# Patient Record
Sex: Female | Born: 1985 | Race: White | Hispanic: No | Marital: Married | Smoking: Current every day smoker
Health system: Southern US, Community
[De-identification: ages and names within clinical notes are randomized; demographics above are authoritative.]

## PROBLEM LIST (undated history)

## (undated) DIAGNOSIS — R519 Headache, unspecified: Secondary | ICD-10-CM

## (undated) DIAGNOSIS — O0990 Supervision of high risk pregnancy, unspecified, unspecified trimester: Secondary | ICD-10-CM

## (undated) DIAGNOSIS — D279 Benign neoplasm of unspecified ovary: Secondary | ICD-10-CM

## (undated) DIAGNOSIS — N979 Female infertility, unspecified: Secondary | ICD-10-CM

## (undated) DIAGNOSIS — R51 Headache: Secondary | ICD-10-CM

## (undated) DIAGNOSIS — T8859XA Other complications of anesthesia, initial encounter: Secondary | ICD-10-CM

## (undated) DIAGNOSIS — R635 Abnormal weight gain: Secondary | ICD-10-CM

## (undated) DIAGNOSIS — E282 Polycystic ovarian syndrome: Secondary | ICD-10-CM

## (undated) DIAGNOSIS — T4145XA Adverse effect of unspecified anesthetic, initial encounter: Secondary | ICD-10-CM

## (undated) DIAGNOSIS — S92909A Unspecified fracture of unspecified foot, initial encounter for closed fracture: Secondary | ICD-10-CM

## (undated) HISTORY — DX: Abnormal weight gain: R63.5

## (undated) HISTORY — PX: ARTHROSCOPIC REPAIR ACL: SUR80

## (undated) HISTORY — PX: WISDOM TOOTH EXTRACTION: SHX21

## (undated) HISTORY — DX: Supervision of high risk pregnancy, unspecified, unspecified trimester: O09.90

## (undated) HISTORY — DX: Female infertility, unspecified: N97.9

---
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
## 2007-10-16 ENCOUNTER — Ambulatory Visit: Payer: Self-pay | Admitting: Occupational Medicine

## 2007-10-16 LAB — CONVERTED CEMR LAB: Beta hcg, urine, semiquantitative: NEGATIVE

## 2007-10-27 ENCOUNTER — Ambulatory Visit: Payer: Self-pay | Admitting: Occupational Medicine

## 2007-10-27 DIAGNOSIS — J209 Acute bronchitis, unspecified: Secondary | ICD-10-CM

## 2007-10-30 ENCOUNTER — Telehealth (INDEPENDENT_AMBULATORY_CARE_PROVIDER_SITE_OTHER): Payer: Self-pay | Admitting: Occupational Medicine

## 2008-04-22 ENCOUNTER — Ambulatory Visit: Payer: Self-pay | Admitting: Family Medicine

## 2008-04-22 DIAGNOSIS — M25519 Pain in unspecified shoulder: Secondary | ICD-10-CM

## 2010-01-01 ENCOUNTER — Ambulatory Visit: Payer: Self-pay | Admitting: Emergency Medicine

## 2010-03-13 NOTE — Assessment & Plan Note (Signed)
Summary: Sinus/eye pressure, cough/runny nose - greenish x 8-9 dys rm 5   Vital Signs:  Patient Profile:   25 Years Old Female CC:      Cold & URI symptoms Height:     64 inches Weight:      219 pounds O2 Sat:      98 % O2 treatment:    Room Air Temp:     98.2 degrees F oral Pulse rate:   85 / minute Pulse rhythm:   regular Resp:     15 per minute BP sitting:   132 / 84  (left arm) Cuff size:   regular  Vitals Entered By: Areta Haber CMA (January 01, 2010 12:25 PM)                  Current Allergies: ! PENICILLIN V POTASSIUM (PENICILLIN V POTASSIUM)     History of Present Illness Chief Complaint: Cold & URI symptoms History of Present Illness: Patient complains of onset of cold symptoms for 9 days.  They have been using Dayquil, Nyquil, and Mucinex which is helping a little bit.  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 + sore throat + cough No pleuritic pain No wheezing + nasal congestion + post-nasal drainage + sinus pain/pressure No itchy/red eyes No earache No hemoptysis No SOB + chills/sweats No fever No nausea No vomiting No abdominal pain No diarrhea No skin rashes + fatigue + myalgias No headache   Current Problems: UPPER RESPIRATORY INFECTION, ACUTE (ICD-465.9) SHOULDER PAIN, LEFT (ICD-719.41) PREVENTIVE HEALTH CARE (ICD-V70.0) BRONCHITIS, ACUTE (ICD-466.0)   Current Meds DAYQUIL MULTI-SYMPTOM 30-325-10 MG/15ML LIQD (PSEUDOEPHEDRINE-APAP-DM) as directed VICKS NYQUIL MULTI-SYMPTOM 15-6.25-325 MG CAPS (DM-DOXYLAMINE-ACETAMINOPHEN) as directed * CHLORMED as directed by OBGYN ZITHROMAX Z-PAK 250 MG TABS (AZITHROMYCIN) use as directed PREDNISONE 10 MG TABS (PREDNISONE) 20mg  two times a day for 5 days  REVIEW OF SYSTEMS Constitutional Symptoms      Denies fever, chills, night sweats, weight loss, weight gain, and fatigue.  Eyes       Complains of eye pain.      Denies change in vision, eye  discharge, glasses, contact lenses, and eye surgery.      Comments: pressure Ear/Nose/Throat/Mouth       Complains of frequent runny nose, sinus problems, sore throat, and hoarseness.      Denies hearing loss/aids, change in hearing, ear pain, ear discharge, dizziness, frequent nose bleeds, and tooth pain or bleeding.      Comments: greenisxh x 8-9 dys Respiratory       Complains of productive cough.      Denies dry cough, wheezing, shortness of breath, asthma, bronchitis, and emphysema/COPD.      Comments: chest congestion Cardiovascular       Denies murmurs, chest pain, and tires easily with exhertion.    Gastrointestinal       Complains of stomach pain and nausea/vomiting.      Denies diarrhea, constipation, blood in bowel movements, and indigestion. Genitourniary       Denies painful urination, kidney stones, and loss of urinary control. Neurological       Complains of headaches.      Denies paralysis, seizures, and fainting/blackouts. Musculoskeletal       Denies muscle pain, joint pain, joint stiffness, decreased range of motion, redness, swelling, muscle weakness, and gout.  Skin       Denies bruising, unusual mles/lumps or sores, and hair/skin or nail changes.  Psych  Denies mood changes, temper/anger issues, anxiety/stress, speech problems, depression, and sleep problems. Other Comments: Pt does not have a PCP. Card given.   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  Married to [employer]. 1 child.  Former Smoker Alcohol use-yes Drug use-no Regular exercise-yes  Risk Factors: Alcohol Use: <1 (04/22/2008) Diet: fair (04/22/2008) Exercise: yes (04/22/2008)  Risk  Factors: Smoking Status: quit (04/22/2008) Physical Exam General appearance: well developed, well nourished, no acute distress Ears: normal, no lesions or deformities Nasal: mucosa pink, nonedematous, no septal deviation, turbinates normal Oral/Pharynx: pharyngeal erythema without exudate, uvula midline without deviation Chest/Lungs: no rales, wheezes, or rhonchi bilateral, breath sounds equal without effort Heart: regular rate and  rhythm, no murmur Skin: no obvious rashes or lesions MSE: oriented to time, place, and person Assessment New Problems: UPPER RESPIRATORY INFECTION, ACUTE (ICD-465.9)   Patient Education: Patient and/or caregiver instructed in the following: rest, fluids, Tylenol prn.  Plan New Medications/Changes: PREDNISONE 10 MG TABS (PREDNISONE) 20mg  two times a day for 5 days  #QS x 0, 01/01/2010, Hoyt Koch MD ZITHROMAX Z-PAK 250 MG TABS (AZITHROMYCIN) use as directed  #1 x 0, 01/01/2010, Hoyt Koch MD  New Orders: Est. Patient Level III [phone] Planning Comments:   1)  Take the prescribed antibiotic as instructed. 2)  Use nasal saline solution (over the counter) at least 3 times a day. 3)  Use over the counter decongestants like Zyrtec-D every 12 hours as needed to help with congestion. 4)  Can take tylenol every 6 hours or motrin every 8 hours for pain or fever. 5)  Follow up with your primary doctor  if no improvement in 5-7 days, sooner if increasing pain, fever, or new symptoms.  6) Continue with Nyquil for cough and cold symptoms   The patient and/or caregiver has been counseled thoroughly with regard to medications prescribed including dosage, schedule, interactions, rationale for use, and possible side effects and they verbalize understanding.  Diagnoses and expected course of recovery discussed and will return if not improved as expected or if the condition worsens. Patient and/or caregiver verbalized understanding.   Prescriptions: PREDNISONE 10 MG TABS (PREDNISONE) 20mg  two times a day for 5 days  #QS x 0   Entered and Authorized by:   Hoyt Koch MD   Signed by:   Hoyt Koch MD on 01/01/2010   Method used:   Print then Give to Patient   RxID:   [id number] ZITHROMAX Z-PAK 250 MG TABS (AZITHROMYCIN) use as directed  #1 x 0   Entered and Authorized by:   Hoyt Koch MD   Signed by:   Hoyt Koch MD on 01/01/2010   Method used:   Print then Give to Patient   RxID:   [id number]   Orders Added: 1)  Est. Patient Level III [29528]

## 2010-06-28 ENCOUNTER — Telehealth: Payer: Self-pay | Admitting: Family Medicine

## 2010-06-28 NOTE — Telephone Encounter (Signed)
Pt called and hasn't been seen here in a while, but is  Coming back and front office sched for next Tuesday 07-03-10 @ 1:00pm.  Has been seeing OB-GYN but not happy there and doesn't feel they are doing anything for her irregular cycles.  Would like to let Dr. Linford Arnold handle GYN care or at least refer her to someone different.  Using super plus tampon every 2 hours.  Chest discomfort is not assoc with any significant symptoms, but rarely enough has strong family history of young members dying heart attacks recently.   Plan:  An 30 minute appt was scheduled by the front office, and pt was instructed to try some TUMS OTC or maalox antacid to see if would relieve.  Pt noticed chest discomfort when the irreg.cycle started a week ago.  Probable heart burn or indigestion.  Pt instructed to do bland diet and avoid greasy, spicy, fried foods, and to lay off citrus type drinks.  Pt voiced understanding and will call if chest discomfort starts up again. Routed to Dr. Marlyne Beards, LPN [facility]

## 2010-07-03 ENCOUNTER — Ambulatory Visit (INDEPENDENT_AMBULATORY_CARE_PROVIDER_SITE_OTHER): Payer: PRIVATE HEALTH INSURANCE | Admitting: Family Medicine

## 2010-07-03 DIAGNOSIS — N920 Excessive and frequent menstruation with regular cycle: Secondary | ICD-10-CM

## 2010-07-03 DIAGNOSIS — R0789 Other chest pain: Secondary | ICD-10-CM

## 2010-07-03 DIAGNOSIS — N926 Irregular menstruation, unspecified: Secondary | ICD-10-CM

## 2010-07-03 NOTE — Progress Notes (Signed)
  Subjective:    Patient ID: Valerie Shaw, female    DOB: July 22, 1985, 25 y.o.   MRN: [id number]  HPI Says has always had problems with her periods. She has been on birth control for awhile to help her regulate her periods. She came off several years ago because she wanted to get pregnant. She does have one child but was able to get pregnant after one round of Clomid. She will sometimes skip a period and sometimes will bleed for a month. She feels her periods were heavy.  She has been on and off clomid for the last 2 years.  Her periods lastely have been much heavier. Has had a normal pap. She has been going to [facility].  She does complain of heavy hair growth on her chin which she has to wax. She also has acne on her face. She has significant difficulty losing weight and has been overweight most of her life. Though she does weigh more now than she has in a while. She says she has occasionally had abnormal sugar levels when they been tested for different reasons such as at work. At other times they're completely normal.  She also complains of atypical chest pain today. She says it's mostly in the middle of the chest. At times it can be a very sharp intense pain it only lasts seconds. At other times he can go more distal Akin actually last for several hours. It is not triggered by activity. She is not sure if it is triggered by food or not eating. No fever or cough or shortness of breath. No prior history of asthma. No prior history of gallbladder problems. No recent changes in bowel movements. No blood in the stool. She is very concerned because she has a cousin who recently died at age 10 from some type of heart disease. She's not sure exactly what. She says she's never had reflux in her life but did try some TUMS at the last week and says sometimes it actually did seem to ease her symptoms. She denies any brash. Review of Systems     Objective:   Physical Exam    she is an overweight female she  does have some evidence of on her chin as well as some cystic acne on her chin.    Assessment & Plan:  Menorrhagia- explained to her that the most common treatments are birth control. We can certainly decrease her bleeding immediately by using Provera but she says her bleeding is that she started to finally taper off for this particular episode. I also discussed ablation as an option but not while trying to get pregnant as this causes scar tissue and impedance fertility. We did discuss the importance of weight loss and reducing her overall estrogen level III weight loss and this may help with the menorrhagia as well.  Irregular periods - Consider PCOS. She has opligomenorrhea, acne, obese, infertility, excess facial hair growth. Will get some labs to rule out thyroid dysfunction, hyperprolactinemia, etc.  If all normal then consider starting metformin. We also discussed the importance of exercise adn weight loss and low fat diet.  This may also improve her infertility.   We spent 25 minutes in discussion about these particular issues and discussing options for treatment.

## 2010-07-04 ENCOUNTER — Telehealth: Payer: Self-pay | Admitting: Family Medicine

## 2010-07-04 DIAGNOSIS — R0789 Other chest pain: Secondary | ICD-10-CM | POA: Insufficient documentation

## 2010-07-04 LAB — LIPID PANEL
Cholesterol: 171 mg/dL (ref 0–200)
Total CHOL/HDL Ratio: 4.1 Ratio
Triglycerides: 154 mg/dL — ABNORMAL HIGH (ref <150)

## 2010-07-04 LAB — GLUCOSE, RANDOM: Glucose, Bld: 85 mg/dL (ref 70–99)

## 2010-07-04 LAB — TSH: TSH: 1.322 u[IU]/mL (ref 0.350–4.500)

## 2010-07-04 LAB — TESTOSTERONE, FREE, TOTAL, SHBG: Testosterone: 100.71 ng/dL — ABNORMAL HIGH (ref 10–70)

## 2010-07-04 MED ORDER — METFORMIN HCL 500 MG PO TABS: 500.0000 mg | ORAL_TABLET | Freq: Three times a day (TID) | ORAL | Status: DC

## 2010-07-04 NOTE — Assessment & Plan Note (Signed)
This is unlikely to be cardiac based on her age that period that she is very concerned because she recently had a cousin that died age 26 from some type of heart disease. We did EKG today which was normal. Normal sinus rhythm with no acute abnormalities. I really do think that this could be reflux. She did try some times over the last week and says that sometimes it did actually help relieve her symptoms. I gave her samples of PPI that we had here in the office for her to try for the next 10 days. She's to call me in a week if she feels it is helping and we can get her prescription. She feels it is not helping then we will need to consider further evaluation.

## 2010-07-04 NOTE — Telephone Encounter (Signed)
Call pt: Labs are nl except for testosterone levels are slightly elevate which is commond in about 60-80% of women with PCOS. I would really like her to try metformin for PCOS.  I will send over rx. Can cause some loose stools when first start it but that usually gets better after a couple of weeks. If loose stools a problem then can back down to twice a day on the med.  Alo work on low fat diet and exercise for weight loss while on the metformin ot improve fertility. F/U in 2 mo.

## 2010-07-05 ENCOUNTER — Telehealth: Payer: Self-pay | Admitting: *Deleted

## 2010-07-05 MED ORDER — MEDROXYPROGESTERONE ACETATE 10 MG PO TABS: 10.0000 mg | ORAL_TABLET | Freq: Every day | ORAL | Status: DC

## 2010-07-05 NOTE — Telephone Encounter (Addendum)
Pt called and states she would like the rx that you and she had talked about in re to stopping her heavy cycle. Pt states she wants it called into [facility] in [place]

## 2010-07-05 NOTE — Telephone Encounter (Signed)
Called and left message to call back re labs

## 2010-07-05 NOTE — Telephone Encounter (Signed)
Provera sent. Take for 10 days and then stop. It will stop her period for now but it will likely restart after 10 days but lighter.

## 2010-07-06 NOTE — Telephone Encounter (Signed)
Called and left message on cell that rx was sent and dr instructions

## 2010-07-06 NOTE — Telephone Encounter (Signed)
Pt.notified

## 2010-07-20 ENCOUNTER — Encounter: Payer: Self-pay | Admitting: Family Medicine

## 2010-08-03 ENCOUNTER — Ambulatory Visit: Payer: PRIVATE HEALTH INSURANCE | Admitting: Family Medicine

## 2012-01-13 ENCOUNTER — Encounter: Payer: Self-pay | Admitting: Emergency Medicine

## 2012-01-13 ENCOUNTER — Emergency Department
Admission: EM | Admit: 2012-01-13 | Discharge: 2012-01-13 | Disposition: A | Discharge status: Home / Self Care | Payer: PRIVATE HEALTH INSURANCE | Source: Home / Self Care | Attending: Family Medicine | Admitting: Family Medicine

## 2012-01-13 DIAGNOSIS — J069 Acute upper respiratory infection, unspecified: Secondary | ICD-10-CM

## 2012-01-13 DIAGNOSIS — M940 Chondrocostal junction syndrome [Tietze]: Secondary | ICD-10-CM

## 2012-01-13 HISTORY — DX: Polycystic ovarian syndrome: E28.2

## 2012-01-13 MED ORDER — AZITHROMYCIN 250 MG PO TABS: ORAL_TABLET | ORAL | Status: DC

## 2012-01-13 MED ORDER — BENZONATATE 200 MG PO CAPS: 200.0000 mg | ORAL_CAPSULE | Freq: Every day | ORAL | Status: DC

## 2012-01-13 NOTE — ED Provider Notes (Signed)
History     CSN: [id number]  Arrival date & time 01/13/12  1014   First MD Initiated Contact with Patient 01/13/12 1114      Chief Complaint  Patient presents with  . Cough      HPI Comments: Patient complains of approximately 7 day history of gradually progressive URI symptoms beginning with a mild nasal congestion.  A cough started about 3 days ago.  Complains of fatigue but no myalgias.  Cough is now worse at night and generally non-productive during the day.  There has been no shortness of breath, or wheezes, but she complains of tightness in her anterior chest.  The history is provided by the patient.    Past Medical History  Diagnosis Date  . PCOS (polycystic ovarian syndrome)     Past Surgical History  Procedure Date  . Cesarean section     Family History  Problem Relation Age of Onset  . Drug abuse Father     History  Substance Use Topics  . Smoking status: Former Smoker    Types: Cigarettes    Quit date: 01/12/2005  . Smokeless tobacco: Not on file  . Alcohol Use: Yes    OB History    Grav Para Term Preterm Abortions TAB SAB Ect Mult Living                  Review of Systems Minimal sore throat + cough No pleuritic pain but complains of tightness in anterior chest No wheezing + nasal congestion + post-nasal drainage No sinus pain/pressure No itchy/red eyes No earache No hemoptysis No SOB No fever/chills No nausea No vomiting No abdominal pain No diarrhea No urinary symptoms No skin rashes + fatigue ? myalgias + headache Used OTC meds without relief  Allergies  Penicillins  Home Medications   Current Outpatient Rx  Name  Route  Sig  Dispense  Refill  . AZITHROMYCIN 250 MG PO TABS      Take 2 tabs today; then begin one tab once daily for 4 more days. (Rx void after 01/20/12)   6 each   0   . BENZONATATE 200 MG PO CAPS   Oral   Take 1 capsule (200 mg total) by mouth at bedtime. Take as needed for cough   12 capsule   0     . MEDROXYPROGESTERONE ACETATE 10 MG PO TABS   Oral   Take 1 tablet (10 mg total) by mouth daily.   10 tablet   0   . METFORMIN HCL 500 MG PO TABS   Oral   Take 1 tablet (500 mg total) by mouth 3 (three) times daily.   90 tablet   1     BP 120/82  Pulse 72  Temp 97.8 F (36.6 C) (Oral)  Resp 16  Ht 5\' 5"  (1.651 m)  Wt 224 lb (101.606 kg)  BMI 37.28 kg/m2  SpO2 100%  Physical Exam Nursing notes and Vital Signs reviewed. Appearance:  Patient appears stated age, and in no acute distress.  Patient is obese (BMI 37.3) Eyes:  Pupils are equal, round, and reactive to light and accomodation.  Extraocular movement is intact.  Conjunctivae are not inflamed  Ears:  Canals normal.  Tympanic membranes normal.  Nose:  Mildly congested turbinates.  No sinus tenderness.   Pharynx:  Normal Neck:  Supple.  Tender shotty posterior nodes are palpated bilaterally  Lungs:  Clear to auscultation.  Breath sounds are equal.  Chest:  Distinct tenderness  to palpation over the mid-sternum.  Heart:  Regular rate and rhythm without murmurs, rubs, or gallops.  Abdomen:  Nontender without masses or hepatosplenomegaly.  Bowel sounds are present.  No CVA or flank tenderness.  Extremities:  No edema.  No calf tenderness Skin:  No rash present.   ED Course  Procedures  none      1. Acute upper respiratory infections of unspecified site   2. Costochondritis, acute       MDM  There is no evidence of bacterial infection today.   Treat symptomatically for now:  Prescription written for Benzonatate [facility]) to take at bedtime for night-time cough.  Take Mucinex D (guaifenesin with decongestant) twice daily for congestion.  Increase fluid intake, rest. May use Afrin nasal spray (or generic oxymetazoline) twice daily for about 5 days.  Also recommend using saline nasal spray several times daily and saline nasal irrigation (AYR is a common brand) Stop all antihistamines for now, and other  non-prescription cough/cold preparations. May take Ibuprofen 200mg , 4 tabs every 8 hours with food for chest/sternum discomfort. Begin Azithromycin if not improving about 5 days or if persistent fever develops (Given a prescription to hold, with an expiration date)  Recommend flu shot when well. Follow-up with family doctor if not improving 7 to 10 days.         Lattie Haw, MD 01/13/12 1137

## 2012-01-13 NOTE — ED Notes (Signed)
Cough and congestion x 4 days. 

## 2013-01-05 ENCOUNTER — Other Ambulatory Visit (HOSPITAL_COMMUNITY)
Admission: RE | Admit: 2013-01-05 | Discharge: 2013-01-05 | Disposition: A | Discharge status: Home / Self Care | Payer: PRIVATE HEALTH INSURANCE | Source: Ambulatory Visit | Attending: Family Medicine | Admitting: Family Medicine

## 2013-01-05 ENCOUNTER — Encounter: Payer: Self-pay | Admitting: Family Medicine

## 2013-01-05 ENCOUNTER — Ambulatory Visit (INDEPENDENT_AMBULATORY_CARE_PROVIDER_SITE_OTHER): Payer: PRIVATE HEALTH INSURANCE | Admitting: Family Medicine

## 2013-01-05 VITALS — BP 133/80 | HR 68 | Temp 97.0°F | Ht 64.0 in | Wt 219.0 lb

## 2013-01-05 DIAGNOSIS — Z124 Encounter for screening for malignant neoplasm of cervix: Secondary | ICD-10-CM

## 2013-01-05 DIAGNOSIS — Z0000 Encounter for general adult medical examination without abnormal findings: Secondary | ICD-10-CM

## 2013-01-05 DIAGNOSIS — Z23 Encounter for immunization: Secondary | ICD-10-CM

## 2013-01-05 DIAGNOSIS — Z113 Encounter for screening for infections with a predominantly sexual mode of transmission: Secondary | ICD-10-CM | POA: Insufficient documentation

## 2013-01-05 DIAGNOSIS — Z01419 Encounter for gynecological examination (general) (routine) without abnormal findings: Secondary | ICD-10-CM | POA: Insufficient documentation

## 2013-01-05 DIAGNOSIS — M25562 Pain in left knee: Secondary | ICD-10-CM

## 2013-01-05 NOTE — Progress Notes (Signed)
Subjective:     Valerie Shaw is a 27 y.o. female and is here for a comprehensive physical exam. The patient reports no problems.  Later she mentioned her left knee. She says sometimes it feels like it's going to hyperextend backwards and give out. No known trauma or injuries. It started a couple months ago. Happens intermittently. No specific triggers. It's uncomfortable for a few seconds the pain does not persist. She denies any old injuries from years ago. The she did play softball when she was younger.  History   Social History  . Marital Status: Married    Spouse Name: N/A    Number of Children: N/A  . Years of Education: N/A   Occupational History  . Not on file.   Social History Main Topics  . Smoking status: Former Smoker    Types: Cigarettes    Quit date: 01/12/2005  . Smokeless tobacco: Not on file  . Alcohol Use: Yes  . Drug Use: No  . Sexual Activity:    Other Topics Concern  . Not on file   Social History Narrative   Exercising some.    Health Maintenance  Topic Date Due  . Influenza Vaccine  09/11/2013  . Pap Smear  01/06/2016  . Tetanus/tdap  01/06/2023    The following portions of the patient's history were reviewed and updated as appropriate: allergies, current medications, past family history, past medical history, past social history, past surgical history and problem list.  Review of Systems A comprehensive review of systems was negative.   Objective:    BP 133/80  Pulse 68  Temp(Src) 97 F (36.1 C)  Ht 5\' 4"  (1.626 m)  Wt 219 lb (99.338 kg)  BMI 37.57 kg/m2 General appearance: alert, cooperative and appears stated age Head: Normocephalic, without obvious abnormality, atraumatic Eyes: conj clear, EOM, PEERLA Ears: normal TM's and external ear canals both ears Nose: Nares normal. Septum midline. Mucosa normal. No drainage or sinus tenderness. Throat: lips, mucosa, and tongue normal; teeth and gums normal Neck: no adenopathy, no  carotid bruit, no JVD, supple, symmetrical, trachea midline and thyroid not enlarged, symmetric, no tenderness/mass/nodules Back: symmetric, no curvature. ROM normal. No CVA tenderness. Lungs: clear to auscultation bilaterally Breasts: normal appearance, no masses or tenderness Heart: regular rate and rhythm, S1, S2 normal, no murmur, click, rub or gallop Abdomen: soft, non-tender; bowel sounds normal; no masses,  no organomegaly Pelvic: cervix normal in appearance, external genitalia normal, no adnexal masses or tenderness, no cervical motion tenderness, rectovaginal septum normal, uterus normal size, shape, and consistency and vagina normal without discharge Extremities: extremities normal, atraumatic, no cyanosis or edema.  Left knee with NROM and no excess laxity of either knee. Tender medially along the joint line.  nontender around the patella.  Normal flexion and extension Pulses: 2+ and symmetric Skin: Skin color, texture, turgor normal. No rashes or lesions Lymph nodes: Cervical, supraclavicular, and axillary nodes normal. Neurologic: Alert and oriented X 3, normal strength and tone. Normal symmetric reflexes. Normal coordination and gait    Assessment:    Healthy female exam.      Plan:     See After Visit Summary for Counseling Recommendations  Keep up a regular exercise program and make sure you are eating a healthy diet Try to eat 4 servings of dairy a day, or if you are lactose intolerant take a calcium with vitamin D daily.  Your vaccines are up to date.   Left knee pain - She is  tender medially. Suspect medial collateral ligament strain. Given handout on exercises to do on her own home. If she's not improving over the next 3-4 weeks and she still feeling like her knee is hyperextending them please let me know. Will get x-ray at that time. Consider further w/u for evluation of injury to the PCL.

## 2013-01-05 NOTE — Patient Instructions (Signed)
Try the exercises for your knee. If not better in 3-4 weeks then let me know and we can get an xray Keep up a regular exercise program and make sure you are eating a healthy diet Try to eat 4 servings of dairy a day, or if you are lactose intolerant take a calcium with vitamin D daily.  Your vaccines are up to date.

## 2013-12-06 ENCOUNTER — Ambulatory Visit (HOSPITAL_COMMUNITY)
Admission: RE | Admit: 2013-12-06 | Discharge: 2013-12-06 | Disposition: A | Discharge status: Home / Self Care | Payer: 59 | Source: Ambulatory Visit | Attending: Family Medicine | Admitting: Family Medicine

## 2013-12-06 ENCOUNTER — Other Ambulatory Visit (HOSPITAL_COMMUNITY): Payer: Self-pay | Admitting: *Deleted

## 2013-12-06 DIAGNOSIS — M25472 Effusion, left ankle: Secondary | ICD-10-CM | POA: Diagnosis not present

## 2013-12-06 DIAGNOSIS — M25572 Pain in left ankle and joints of left foot: Secondary | ICD-10-CM | POA: Diagnosis not present

## 2013-12-06 DIAGNOSIS — M25475 Effusion, left foot: Secondary | ICD-10-CM | POA: Insufficient documentation

## 2013-12-06 DIAGNOSIS — M7989 Other specified soft tissue disorders: Secondary | ICD-10-CM | POA: Diagnosis not present

## 2013-12-06 DIAGNOSIS — M79609 Pain in unspecified limb: Secondary | ICD-10-CM | POA: Diagnosis not present

## 2013-12-06 NOTE — Progress Notes (Signed)
Left lower extremity venous duplex completed.  Left:  No evidence of DVT, superficial thrombosis, or Baker's cyst.  Right:  Negative for DVT in the common femoral vein.  

## 2014-08-01 ENCOUNTER — Telehealth: Payer: Self-pay | Admitting: *Deleted

## 2014-08-01 DIAGNOSIS — Z0000 Encounter for general adult medical examination without abnormal findings: Secondary | ICD-10-CM

## 2014-08-01 NOTE — Telephone Encounter (Signed)
Fasting labs ordered for upcoming CPE per pt request.

## 2014-08-02 LAB — COMPLETE METABOLIC PANEL WITH GFR
ALK PHOS: 67 U/L (ref 39–117)
ALT: 9 U/L (ref 0–35)
AST: 16 U/L (ref 0–37)
Albumin: 4.2 g/dL (ref 3.5–5.2)
BUN: 14 mg/dL (ref 6–23)
CALCIUM: 9.3 mg/dL (ref 8.4–10.5)
CHLORIDE: 105 meq/L (ref 96–112)
CO2: 23 mEq/L (ref 19–32)
CREATININE: 0.79 mg/dL (ref 0.50–1.10)
GFR, Est African American: >89 mL/min
GFR, Est Non African American: >89 mL/min
Glucose, Bld: 85 mg/dL (ref 70–99)
Potassium: 4.6 mEq/L (ref 3.5–5.3)
Sodium: 139 mEq/L (ref 135–145)
Total Bilirubin: 0.5 mg/dL (ref 0.2–1.2)
Total Protein: 6.8 g/dL (ref 6.0–8.3)

## 2014-08-02 LAB — LIPID PANEL
CHOLESTEROL: 173 mg/dL (ref 0–200)
HDL: 43 mg/dL — ABNORMAL LOW (ref >=46)
LDL CALC: 114 mg/dL — AB (ref 0–99)
TRIGLYCERIDES: 82 mg/dL (ref <150)
Total CHOL/HDL Ratio: 4 Ratio
VLDL: 16 mg/dL (ref 0–40)

## 2014-08-08 ENCOUNTER — Encounter: Payer: Self-pay | Admitting: *Deleted

## 2014-08-08 ENCOUNTER — Encounter: Payer: Self-pay | Admitting: Family Medicine

## 2014-08-08 ENCOUNTER — Ambulatory Visit (INDEPENDENT_AMBULATORY_CARE_PROVIDER_SITE_OTHER): Payer: Managed Care, Other (non HMO) | Admitting: Family Medicine

## 2014-08-08 VITALS — BP 124/82 | HR 79 | Ht 64.0 in | Wt 227.0 lb

## 2014-08-08 DIAGNOSIS — Z0000 Encounter for general adult medical examination without abnormal findings: Secondary | ICD-10-CM | POA: Diagnosis not present

## 2014-08-08 DIAGNOSIS — E282 Polycystic ovarian syndrome: Secondary | ICD-10-CM | POA: Diagnosis not present

## 2014-08-08 MED ORDER — SPIRONOLACTONE 50 MG PO TABS: 50.0000 mg | ORAL_TABLET | Freq: Two times a day (BID) | ORAL | Status: DC

## 2014-08-08 NOTE — Progress Notes (Signed)
CC: Valerie Shaw is a 29 y.o. female is here for Annual Exam   Subjective: HPI:  Colonoscopy: No current indication, urged to begin screening at age 77 Papsmear: Up-to-date as of 2014 repeat as soon as 2017 Mammogram: (No current indication   Influenza Vaccine: No current indication Pneumovax: No current indication Td/Tdap: UTD 2014 Zoster: (Start 30 yo)   Presents for complete physical exam requesting guidance on whether or not there is any medication she can take other than metformin to help with polycystic ovarian syndrome. She is not interested in getting pregnant anytime soon and states that her major complaints with PCO S is hair growing on her face, on her body, and irregular periods. She's had metformin in the past which has been beneficial however causes intolerable gastrointestinal complaints  Review of Systems - General ROS: negative for - chills, fever, night sweats, weight gain or weight loss Ophthalmic ROS: negative for - decreased vision Psychological ROS: negative for - anxiety or depression ENT ROS: negative for - hearing change, nasal congestion, tinnitus or allergies Hematological and Lymphatic ROS: negative for - bleeding problems, bruising or swollen lymph nodes Breast ROS: negative Respiratory ROS: no cough, shortness of breath, or wheezing Cardiovascular ROS: no chest pain or dyspnea on exertion Gastrointestinal ROS: no abdominal pain, change in bowel habits, or black or bloody stools Genito-Urinary ROS: negative for - genital discharge, genital ulcers, incontinence or abnormal bleeding from genitals Musculoskeletal ROS: negative for - joint pain or muscle pain Neurological ROS: negative for - headaches or memory loss Dermatological ROS: negative for lumps, mole changes, rash and skin lesion changes  Past Medical History  Diagnosis Date  . PCOS (polycystic ovarian syndrome)     Past Surgical History  Procedure Laterality Date  . Cesarean section      Family History  Problem Relation Age of Onset  . Drug abuse Father   . Hypertension    . Hypothyroidism Mother     History   Social History  . Marital Status: Married    Spouse Name: N/A  . Number of Children: N/A  . Years of Education: N/A   Occupational History  . Not on file.   Social History Main Topics  . Smoking status: Former Smoker    Types: Cigarettes    Quit date: 01/12/2005  . Smokeless tobacco: Not on file  . Alcohol Use: Yes  . Drug Use: No  . Sexual Activity: Not on file   Other Topics Concern  . Not on file   Social History Narrative   Exercising some.      Objective: BP 124/82 mmHg  Pulse 79  Ht 5\' 4"  (1.626 m)  Wt 227 lb (102.967 kg)  BMI 38.95 kg/m2  General: No Acute Distress HEENT: Atraumatic, normocephalic, conjunctivae normal without scleral icterus.  No nasal discharge, hearing grossly intact, TMs with good landmarks bilaterally with no middle ear abnormalities, posterior pharynx clear without oral lesions. Neck: Supple, trachea midline, no cervical nor supraclavicular adenopathy. Pulmonary: Clear to auscultation bilaterally without wheezing, rhonchi, nor rales. Cardiac: Regular rate and rhythm.  No murmurs, rubs, nor gallops. No peripheral edema.  2+ peripheral pulses bilaterally. Abdomen: Bowel sounds normal.  No masses.  Non-tender without rebound.  Negative Murphy's sign. MSK: Grossly intact, no signs of weakness.  Full strength throughout upper and lower extremities.  Full ROM in upper and lower extremities.  No midline spinal tenderness. Neuro: Gait unremarkable, CN II-XII grossly intact.  C5-C6 Reflex 2/4 Bilaterally, L4 Reflex 2/4  Bilaterally.  Cerebellar function intact. Skin: No rashes. Slightly inflamed skin tag on the back between the scapula Psych: Alert and oriented to person/place/time.  Thought process normal. No anxiety/depression.   Assessment & Plan: Valerie Shaw was seen today for annual exam.  Diagnoses and all orders  for this visit:  Annual physical exam  PCOS (polycystic ovarian syndrome) Orders: -     spironolactone (ALDACTONE) 50 MG tablet; Take 1 tablet (50 mg total) by mouth 2 (two) times daily.   Healthy lifestyle interventions including but not limited to regular exercise, a healthy low fat diet, moderation of salt intake, the dangers of tobacco/alcohol/recreational drug use, nutrition supplementation, and accident avoidance were discussed with the patient and a handout was provided for future reference. PCO S: Start spironolactone provided she is not actively trying or interested in getting pregnant. Discussed if she does her summers in become pregnant stop this medication immediately.  Return if symptoms worsen or fail to improve.

## 2014-08-08 NOTE — Patient Instructions (Signed)
Dr. Glora Hulgan's General Advice Following Your Complete Physical Exam  The Benefits of Regular Exercise: Unless you suffer from an uncontrolled cardiovascular condition, studies strongly suggest that regular exercise and physical activity will add to both the quality and length of your life.  The World Health Organization recommends 150 minutes of moderate intensity aerobic activity every week.  This is best split over 3-4 days a week, and can be as simple as a brisk walk for just over 35 minutes "most days of the week".  This type of exercise has been shown to lower LDL-Cholesterol, lower average blood sugars, lower blood pressure, lower cardiovascular disease risk, improve memory, and increase one's overall sense of wellbeing.  The addition of anaerobic (or "strength training") exercises offers additional benefits including but not limited to increased metabolism, prevention of osteoporosis, and improved overall cholesterol levels.  How Can I Strive For A Low-Fat Diet?: Current guidelines recommend that 25-35 percent of your daily energy (food) intake should come from fats.  One might ask how can this be achieved without having to dissect each meal on a daily basis?  Switch to skim or 1% milk instead of whole milk.  Focus on lean meats such as ground turkey, fresh fish, baked chicken, and lean cuts of beef as your source of dietary protein.  Limit saturated fat consumption to less than 10% of your daily caloric intake.  Limit trans fatty acid consumption primarily by limiting synthetic trans fats such as partially hydrogenated oils (Ex: fried fast foods).  Substitute olive or vegetable oil for solid fats where possible.  Moderation of Salt Intake: Provided you don't carry a diagnosis of congestive heart failure nor renal failure, I recommend a daily allowance of no more than 2300 mg of salt (sodium).  Keeping under this daily goal is associated with a decreased risk of cardiovascular events, creeping  above it can lead to elevated blood pressures and increases your risk of cardiovascular events.  Milligrams (mg) of salt is listed on all nutrition labels, and your daily intake can add up faster than you think.  Most canned and frozen dinners can pack in over half your daily salt allowance in one meal.    Lifestyle Health Risks: Certain lifestyle choices carry specific health risks.  As you may already know, tobacco use has been associated with increasing one's risk of cardiovascular disease, pulmonary disease, numerous cancers, among many other issues.  What you may not know is that there are medications and nicotine replacement strategies that can more than double your chances of successfully quitting.  I would be thrilled to help manage your quitting strategy if you currently use tobacco products.  When it comes to alcohol use, I've yet to find an "ideal" daily allowance.  Provided an individual does not have a medical condition that is exacerbated by alcohol consumption, general guidelines determine "safe drinking" as no more than two standard drinks for a man or no more than one standard drink for a female per day.  However, much debate still exists on whether any amount of alcohol consumption is technically "safe".  My general advice, keep alcohol consumption to a minimum for general health promotion.  If you or others believe that alcohol, tobacco, or recreational drug use is interfering with your life, I would be happy to provide confidential counseling regarding treatment options.  General "Over The Counter" Nutrition Advice: Postmenopausal women should aim for a daily calcium intake of 1200 mg, however a significant portion of this might already be   provided by diets including milk, yogurt, cheese, and other dairy products.  Vitamin D has been shown to help preserve bone density, prevent fatigue, and has even been shown to help reduce falls in the elderly.  Ensuring a daily intake of 800 Units of  Vitamin D is a good place to start to enjoy the above benefits, we can easily check your Vitamin D level to see if you'd potentially benefit from supplementation beyond 800 Units a day.  Folic Acid intake should be of particular concern to women of childbearing age.  Daily consumption of 400-800 mcg of Folic Acid is recommended to minimize the chance of spinal cord defects in a fetus should pregnancy occur.    For many adults, accidents still remain one of the most common culprits when it comes to cause of death.  Some of the simplest but most effective preventitive habits you can adopt include regular seatbelt use, proper helmet use, securing firearms, and regularly testing your smoke and carbon monoxide detectors.  Mahmoud Blazejewski B. Guillermo Nehring DO [facility] [address], [address] [place], [place] [address] Phone: [phone]  

## 2014-08-26 ENCOUNTER — Ambulatory Visit: Payer: Managed Care, Other (non HMO) | Admitting: Family Medicine

## 2015-07-13 ENCOUNTER — Other Ambulatory Visit (HOSPITAL_COMMUNITY)
Admission: RE | Admit: 2015-07-13 | Discharge: 2015-07-13 | Disposition: A | Discharge status: Home / Self Care | Payer: Managed Care, Other (non HMO) | Source: Ambulatory Visit | Attending: Family Medicine | Admitting: Family Medicine

## 2015-07-13 ENCOUNTER — Ambulatory Visit (INDEPENDENT_AMBULATORY_CARE_PROVIDER_SITE_OTHER): Payer: Managed Care, Other (non HMO) | Admitting: Family Medicine

## 2015-07-13 ENCOUNTER — Encounter: Payer: Self-pay | Admitting: Family Medicine

## 2015-07-13 VITALS — BP 129/76 | HR 85 | Ht 64.0 in | Wt 233.0 lb

## 2015-07-13 DIAGNOSIS — G5601 Carpal tunnel syndrome, right upper limb: Secondary | ICD-10-CM | POA: Diagnosis not present

## 2015-07-13 DIAGNOSIS — Z01419 Encounter for gynecological examination (general) (routine) without abnormal findings: Secondary | ICD-10-CM | POA: Insufficient documentation

## 2015-07-13 DIAGNOSIS — Z0000 Encounter for general adult medical examination without abnormal findings: Secondary | ICD-10-CM | POA: Diagnosis not present

## 2015-07-13 DIAGNOSIS — Z1151 Encounter for screening for human papillomavirus (HPV): Secondary | ICD-10-CM | POA: Insufficient documentation

## 2015-07-13 DIAGNOSIS — Z114 Encounter for screening for human immunodeficiency virus [HIV]: Secondary | ICD-10-CM

## 2015-07-13 NOTE — Patient Instructions (Signed)
Keep up a regular exercise program and make sure you are eating a healthy diet Try to eat 4 servings of dairy a day, or if you are lactose intolerant take a calcium with vitamin D daily.  Your vaccines are up to date.   

## 2015-07-13 NOTE — Progress Notes (Signed)
Subjective:     Valerie Shaw is a 30 y.o. female and is here for a comprehensive physical exam. The patient reports problems - right hand gonig numb intermittantly. 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 Mostly affecting the first and second fingers. She says it goes numb at night as well. She is right-handed. She has not tried any treatments.  Social History   Social History  . Marital Status: Married    Spouse Name: N/A  . Number of Children: N/A  . Years of Education: N/A   Occupational History  . Not on file.   Social History Main Topics  . Smoking status: Former Smoker    Types: Cigarettes    Quit date: 01/12/2005  . Smokeless tobacco: Not on file  . Alcohol Use: Yes  . Drug Use: No  . Sexual Activity: Not on file   Other Topics Concern  . Not on file   Social History Narrative   Exercising some.    Health Maintenance  Topic Date Due  . HIV Screening  04/04/1999  . INFLUENZA VACCINE  09/12/2015  . PAP SMEAR  01/06/2016  . TETANUS/TDAP  01/06/2023    The following portions of the patient's history were reviewed and updated as appropriate: allergies, current medications, past family history, past medical history, past social history, past surgical history and problem list.  Review of Systems A comprehensive review of systems was negative.   Objective:    BP 129/76 mmHg  Pulse 85  Ht 5\' 4"  (1.626 m)  Wt 233 lb (105.688 kg)  BMI 39.97 kg/m2  SpO2 99%  LMP 06/28/2015 (Approximate) General appearance: alert, cooperative and appears stated age Head: Normocephalic, without obvious abnormality, atraumatic Eyes: conj clear, EOMIi, PEERLA Ears: normal TM's and external ear canals both ears Nose: Nares normal. Septum midline. Mucosa normal. No drainage or sinus tenderness. Throat: lips, mucosa, and tongue normal; teeth and gums normal Neck: no adenopathy, no carotid bruit, no  JVD, supple, symmetrical, trachea midline and thyroid not enlarged, symmetric, no tenderness/mass/nodules Back: symmetric, no curvature. ROM normal. No CVA tenderness. Lungs: clear to auscultation bilaterally Breasts: normal appearance, no masses or tenderness Heart: regular rate and rhythm, S1, S2 normal, no murmur, click, rub or gallop Abdomen: soft, non-tender; bowel sounds normal; no masses,  no organomegaly Pelvic: cervix normal in appearance, external genitalia normal, no adnexal masses or tenderness, no cervical motion tenderness, rectovaginal septum normal, uterus normal size, shape, and consistency and vagina normal without discharge Extremities: extremities normal, atraumatic, no cyanosis or edema Pulses: 2+ and symmetric Skin: Skin color, texture, turgor normal. No rashes or lesions Lymph nodes: Cervical, supraclavicular, and axillary nodes normal. Neurologic: Alert and oriented X 3, normal strength and tone. Normal symmetric reflexes. Normal coordination and gait    Right wrist with normal range of motion and strength in all fingers. Negative Tennille's test but positive Phalen's test. She started expressing numbness at the tips of the first and second fingers.   Assessment:    Healthy female exam.      Plan:     See After Visit Summary for Counseling Recommendations  Keep up a regular exercise program and make sure you are eating a healthy diet Try to eat 4 servings of dairy a day, or if you are lactose intolerant take a calcium with vitamin D daily.  Your vaccines are up to date.   Right carpal tunnel syndrome -  Discussed diagnosis. Given handout. Fitted with cock-up splint, that she will wear just at night. If not improving in the next 3-4 weeks and give [place] a call and we can get her in with one of our sports medicine providers.

## 2015-07-17 LAB — CYTOLOGY - PAP

## 2015-07-31 LAB — HEMOGLOBIN A1C
HEMOGLOBIN A1C: 5.1 % (ref <5.7)
MEAN PLASMA GLUCOSE: 100 mg/dL

## 2015-08-01 LAB — LIPID PANEL
CHOLESTEROL: 172 mg/dL (ref 125–200)
HDL: 48 mg/dL (ref >=46)
LDL Cholesterol: 104 mg/dL (ref <130)
TRIGLYCERIDES: 102 mg/dL (ref <150)
Total CHOL/HDL Ratio: 3.6 Ratio (ref <=5.0)
VLDL: 20 mg/dL (ref <30)

## 2015-08-01 LAB — COMPLETE METABOLIC PANEL WITH GFR
ALT: 7 U/L (ref 6–29)
AST: 13 U/L (ref 10–30)
Albumin: 4.1 g/dL (ref 3.6–5.1)
Alkaline Phosphatase: 60 U/L (ref 33–115)
BILIRUBIN TOTAL: 0.5 mg/dL (ref 0.2–1.2)
BUN: 17 mg/dL (ref 7–25)
CALCIUM: 9 mg/dL (ref 8.6–10.2)
CHLORIDE: 105 mmol/L (ref 98–110)
CO2: 24 mmol/L (ref 20–31)
CREATININE: 0.86 mg/dL (ref 0.50–1.10)
Glucose, Bld: 88 mg/dL (ref 65–99)
Potassium: 4.7 mmol/L (ref 3.5–5.3)
Sodium: 137 mmol/L (ref 135–146)
TOTAL PROTEIN: 6.7 g/dL (ref 6.1–8.1)

## 2015-08-01 LAB — TSH: TSH: 1.79 mIU/L

## 2015-08-01 LAB — HIV ANTIBODY (ROUTINE TESTING W REFLEX): HIV 1&2 Ab, 4th Generation: NONREACTIVE

## 2015-08-02 ENCOUNTER — Telehealth: Payer: Self-pay | Admitting: *Deleted

## 2015-08-02 NOTE — Telephone Encounter (Signed)
Pt would like results emailed to [email or web address]. Given to Tammy P to email to pt.Valerie Shaw

## 2015-08-02 NOTE — Telephone Encounter (Signed)
Health screening form faxed, copied, scanned, conformation received.Valerie Shaw

## 2016-03-15 ENCOUNTER — Encounter: Payer: Self-pay | Admitting: Osteopathic Medicine

## 2016-03-15 ENCOUNTER — Ambulatory Visit (INDEPENDENT_AMBULATORY_CARE_PROVIDER_SITE_OTHER): Payer: Managed Care, Other (non HMO) | Admitting: Osteopathic Medicine

## 2016-03-15 VITALS — BP 138/97 | HR 72 | Temp 98.1°F | Ht 64.0 in | Wt 228.0 lb

## 2016-03-15 DIAGNOSIS — J208 Acute bronchitis due to other specified organisms: Secondary | ICD-10-CM

## 2016-03-15 DIAGNOSIS — J069 Acute upper respiratory infection, unspecified: Secondary | ICD-10-CM

## 2016-03-15 DIAGNOSIS — B9789 Other viral agents as the cause of diseases classified elsewhere: Secondary | ICD-10-CM

## 2016-03-15 MED ORDER — FLUTICASONE-SALMETEROL 115-21 MCG/ACT IN AERO: 2.0000 | INHALATION_SPRAY | Freq: Two times a day (BID) | RESPIRATORY_TRACT | 0 refills | Status: DC

## 2016-03-15 MED ORDER — METHYLPREDNISOLONE 4 MG PO TBPK: ORAL_TABLET | ORAL | 0 refills | Status: DC

## 2016-03-15 MED ORDER — AZITHROMYCIN 250 MG PO TABS: ORAL_TABLET | ORAL | 0 refills | Status: DC

## 2016-03-15 MED ORDER — BENZONATATE 200 MG PO CAPS: 200.0000 mg | ORAL_CAPSULE | Freq: Three times a day (TID) | ORAL | 0 refills | Status: DC | PRN

## 2016-03-15 NOTE — Progress Notes (Signed)
HPI: Valerie Shaw is a 31 y.o. female who presents to [facility] 03/15/16 for chief complaint of: No chief complaint on file.   Acute Illness: . Context: lots of exposure to the public, also renovating an old house, concerned about possible flulike illness versus respiratory irritation due to renovation of the house. . Location/Quality:  chest --> cougihng, sinuses --> sneezing . Assoc signs/symptoms: see ROS . Duration: 7 days ago first started   Past medical, social and family history reviewed. Immune compromising conditions or other risk factors: none  Current medications and allergies reviewed.     Review of Systems:  Constitutional: No  fever/chills  HEENT: Yes  headache, mild sore throat, No  swollen glands  Cardiovascular: No chest pain  Respiratory:Yes  cough, No  shortness of breath  Gastrointestinal: No  nausea, No  vomiting,  No  diarrhea  Musculoskeletal:   No  myalgia/arthralgia  Skin/Integument:  No  rash   Detailed Exam:  BP (!) 138/97   Pulse 72   Temp 98.1 F (36.7 C) (Oral)   Ht 5\' 4"  (1.626 m)   Wt 228 lb (103.4 kg)   BMI 39.14 kg/m   Constitutional:   VSS, see above.   General Appearance: alert, well-developed, well-nourished, NAD  Eyes:   Normal lids and conjunctive, non-icteric sclera  Ears, Nose, Mouth, Throat:   Normal external inspection ears/nares  Normal mouth/lips/gums, MMM  normal TM  posterior pharynx with erythema, without exudate  nasal mucosa normal  Skin:  Normal inspection, no rash or concerning lesions noted on limited exam  Neck:   No masses, trachea midline. normal lymph nodes  Respiratory:   Normal respiratory effort.   No  wheeze/rhonchi/rales  Cardiovascular:   S1/S2 normal, no murmur/rub/gallop auscultated. RRR.     ASSESSMENT/PLAN: Supportive care as below. Given prolonged course of illness consider azithromycin if not improving with other  medications. Most likely viral bronchitis versus post viral cough-type syndrome, likely complicated by exposure to respiratory irritants. Patient is advised to wear appropriate respiratory protection anytime she is around significant dust exposure. She is advised that he could not guarantee whether or not she is contagious at this point, but likely so, recommend basic contact precautions and covering cough  Viral URI with cough - Plan: benzonatate (TESSALON) 200 MG capsule, azithromycin (ZITHROMAX) 250 MG tablet, fluticasone-salmeterol (ADVAIR HFA) 115-21 MCG/ACT inhaler, methylPREDNISolone (MEDROL DOSEPAK) 4 MG TBPK tablet  Viral bronchitis - Plan: benzonatate (TESSALON) 200 MG capsule, azithromycin (ZITHROMAX) 250 MG tablet, fluticasone-salmeterol (ADVAIR HFA) 115-21 MCG/ACT inhaler, methylPREDNISolone (MEDROL DOSEPAK) 4 MG TBPK tablet    Visit summary was printed for the patient with medications and pertinent instructions for patient to review. ER/RTC precautions reviewed. All questions answered. Return if symptoms worsen or fail to improve.

## 2016-03-15 NOTE — Patient Instructions (Signed)

## 2016-04-11 ENCOUNTER — Other Ambulatory Visit: Payer: Self-pay | Admitting: Osteopathic Medicine

## 2016-04-11 DIAGNOSIS — B9789 Other viral agents as the cause of diseases classified elsewhere: Principal | ICD-10-CM

## 2016-04-11 DIAGNOSIS — J208 Acute bronchitis due to other specified organisms: Secondary | ICD-10-CM

## 2016-04-11 DIAGNOSIS — J069 Acute upper respiratory infection, unspecified: Secondary | ICD-10-CM

## 2016-10-08 ENCOUNTER — Emergency Department
Admission: EM | Admit: 2016-10-08 | Discharge: 2016-10-08 | Disposition: A | Discharge status: Home / Self Care | Payer: Managed Care, Other (non HMO) | Source: Home / Self Care | Attending: Family Medicine | Admitting: Family Medicine

## 2016-10-08 ENCOUNTER — Encounter: Payer: Self-pay | Admitting: *Deleted

## 2016-10-08 DIAGNOSIS — B001 Herpesviral vesicular dermatitis: Secondary | ICD-10-CM | POA: Diagnosis not present

## 2016-10-08 MED ORDER — PREDNISONE 20 MG PO TABS: ORAL_TABLET | ORAL | 0 refills | Status: DC

## 2016-10-08 MED ORDER — VALACYCLOVIR HCL 1 G PO TABS: ORAL_TABLET | ORAL | 1 refills | Status: DC

## 2016-10-08 NOTE — ED Provider Notes (Signed)
[name]    CSN: [id number] Arrival date & time: 10/08/16  1728     History   Chief Complaint Chief Complaint  Patient presents with  . Oral Swelling  . Headache    HPI Valerie Shaw is a 31 y.o. female.   Patient was on a camping trip four days ago. Two days ago she developed swelling in her lower lip and headache.  She has been fatigued with myalgias, and yesterday she developed a typical fever blister on her upper lip.  Today she has had increased pain/swelling in her upper lip   The history is provided by the patient.    Past Medical History:  Diagnosis Date  . PCOS (polycystic ovarian syndrome)     Patient Active Problem List   Diagnosis Date Noted  . Atypical chest pain 07/04/2010  . SHOULDER PAIN, LEFT 04/22/2008    Past Surgical History:  Procedure Laterality Date  . ARTHROSCOPIC REPAIR ACL    . CESAREAN SECTION      OB History    No data available       Home Medications    Prior to Admission medications   Medication Sig Start Date End Date Taking? Authorizing Provider  ibuprofen (ADVIL,MOTRIN) 600 MG tablet Take 600 mg by mouth every 6 (six) hours as needed.   Yes [provider]  LYSINE PO Take by mouth.   Yes [provider]  ADVAIR HFA 115-21 MCG/ACT inhaler INHALE 2 PUFFS INTO THE LUNGS 2 (TWO) TIMES DAILY. 04/12/16   Hali Marry, MD  predniSONE (DELTASONE) 20 MG tablet Take one tab by mouth twice daily for 3 days, then one daily. Take with food. 10/08/16   Kandra Nicolas, MD  valACYclovir (VALTREX) 1000 MG tablet Take two tabs by mouth every 12 hours for one day.  Take at onset of cold sore 10/08/16   Kandra Nicolas, MD    Family History Family History  Problem Relation Age of Onset  . Hypothyroidism Mother   . Drug abuse Father   . Hypertension Unknown     Social History Social History  Substance Use Topics  . Smoking status: Current Every Day Smoker    Packs/day: 0.50    Types:  Cigarettes  . Smokeless tobacco: Never Used  . Alcohol use Yes     Allergies   Penicillins   Review of Systems Review of Systems  No sore throat + sore lips/fever blister No cough No pleuritic pain No wheezing + nasal congestion No post-nasal drainage No sinus pain/pressure No itchy/red eyes No earache No hemoptysis No SOB No fever/chills No nausea No vomiting No abdominal pain No diarrhea No urinary symptoms No skin rash + fatigue + myalgias + headache Used OTC meds without relief    Physical Exam Triage Vital Signs ED Triage Vitals  Enc Vitals Group     BP 10/08/16 1749 (!) 131/93     Pulse Rate 10/08/16 1749 76     Resp 10/08/16 1749 16     Temp 10/08/16 1749 98.1 F (36.7 C)     Temp Source 10/08/16 1749 Oral     SpO2 10/08/16 1749 98 %     Weight 10/08/16 1750 225 lb (102.1 kg)     Height 10/08/16 1750 5\' 4"  (1.626 m)     Head Circumference --      Peak Flow --      Pain Score 10/08/16 1750 0     Pain Loc --  Pain Edu? --      Excl. in [place]? --    No data found.   Updated Vital Signs BP (!) 131/93 (BP Location: Left Arm)   Pulse 76   Temp 98.1 F (36.7 C) (Oral)   Resp 16   Ht 5\' 4"  (1.626 m)   Wt 225 lb (102.1 kg)   LMP 10/08/2016   SpO2 98%   BMI 38.62 kg/m   Visual Acuity Right Eye Distance:   Left Eye Distance:   Bilateral Distance:    Right Eye Near:   Left Eye Near:    Bilateral Near:     Physical Exam  Constitutional: She appears well-developed and well-nourished. No distress.  HENT:  Right Ear: Tympanic membrane, external ear and ear canal normal.  Left Ear: Tympanic membrane, external ear and ear canal normal.  Nose: Nose normal.  Mouth/Throat: Oropharynx is clear and moist and mucous membranes are normal.    Upper and lower lips mildly swollen and tender to palpation.  Several crusted areas as noted on diagram.  No erythema, induration, or fluctuance.  Eyes: Pupils are equal, round, and reactive to light.  Conjunctivae and EOM are normal.  Neck: Neck supple.  Bilateral prominent lateral nodes, tender to palpation on the left.  Cardiovascular: Normal rate.   Pulmonary/Chest: Effort normal.  Lymphadenopathy:    She has cervical adenopathy.  Neurological: She is alert.  Skin: Skin is warm and dry.  Nursing note and vitals reviewed.    UC Treatments / Results  Labs (all labs ordered are listed, but only abnormal results are displayed) Labs Reviewed - No data to display  EKG  EKG Interpretation None       Radiology No results found.  Procedures Procedures (including critical care time)  Medications Ordered in UC Medications - No data to display   Initial Impression / Assessment and Plan / UC Course  I have reviewed the triage vital signs and the nursing notes.  Pertinent labs & imaging results that were available during my care of the patient were reviewed by me and considered in my medical decision making (see chart for details).     Patient may be developing early viral URI; note tender lateral cervical nodes. Begin Valtrex, and prednisone burst/taper.   May apply ice to the sores: ? Put ice in a plastic bag. ? Place a towel between your skin and the bag. ? Leave the ice on for 5 to 10 minutes, 2-3 times per day.  Followup with Family Doctor if not improved in one week.   Final Clinical Impressions(s) / UC Diagnoses   Final diagnoses:  Herpes simplex labialis    New Prescriptions New Prescriptions   PREDNISONE (DELTASONE) 20 MG TABLET    Take one tab by mouth twice daily for 3 days, then one daily. Take with food.   VALACYCLOVIR (VALTREX) 1000 MG TABLET    Take two tabs by mouth every 12 hours for one day.  Take at onset of cold sore        Kandra Nicolas, MD 10/14/16 [id number]

## 2016-10-08 NOTE — ED Triage Notes (Signed)
Pt c/o lip swelling, blisters on her lips, HA, and facial swelling x 2 days after a camping trip this weekend. She did apply an SPF sunscreen lip balm to her lips. She has taken Benadryl without relief.

## 2016-10-08 NOTE — Discharge Instructions (Signed)
May apply ice to the sores: Put ice in a plastic bag. Place a towel between your skin and the bag. Leave the ice on for 5 to 10 minutes, 2-3 times per day.

## 2016-12-16 ENCOUNTER — Ambulatory Visit (INDEPENDENT_AMBULATORY_CARE_PROVIDER_SITE_OTHER): Payer: Managed Care, Other (non HMO) | Admitting: Family Medicine

## 2016-12-16 ENCOUNTER — Encounter: Payer: Self-pay | Admitting: Family Medicine

## 2016-12-16 ENCOUNTER — Other Ambulatory Visit: Payer: Self-pay | Admitting: Family Medicine

## 2016-12-16 VITALS — BP 133/84 | HR 78 | Ht 64.0 in | Wt 228.0 lb

## 2016-12-16 DIAGNOSIS — Z6839 Body mass index (BMI) 39.0-39.9, adult: Secondary | ICD-10-CM | POA: Diagnosis not present

## 2016-12-16 DIAGNOSIS — R635 Abnormal weight gain: Secondary | ICD-10-CM | POA: Diagnosis not present

## 2016-12-16 DIAGNOSIS — Z0000 Encounter for general adult medical examination without abnormal findings: Secondary | ICD-10-CM

## 2016-12-16 LAB — LIPID PANEL
CHOL/HDL RATIO: 4.4 (calc) (ref <5.0)
Cholesterol: 196 mg/dL (ref <200)
HDL: 45 mg/dL — ABNORMAL LOW (ref >50)
LDL Cholesterol (Calc): 125 mg/dL (calc) — ABNORMAL HIGH
NON-HDL CHOLESTEROL (CALC): 151 mg/dL — AB (ref <130)
Triglycerides: 150 mg/dL — ABNORMAL HIGH (ref <150)

## 2016-12-16 LAB — COMPLETE METABOLIC PANEL WITH GFR
AG Ratio: 1.5 (calc) (ref 1.0–2.5)
ALT: 9 U/L (ref 6–29)
AST: 16 U/L (ref 10–30)
Albumin: 4.4 g/dL (ref 3.6–5.1)
Alkaline phosphatase (APISO): 74 U/L (ref 33–115)
BUN: 15 mg/dL (ref 7–25)
CALCIUM: 9.7 mg/dL (ref 8.6–10.2)
CO2: 25 mmol/L (ref 20–32)
Chloride: 104 mmol/L (ref 98–110)
Creat: 0.86 mg/dL (ref 0.50–1.10)
GFR, EST AFRICAN AMERICAN: 104 mL/min/{1.73_m2} (ref >=60)
GFR, EST NON AFRICAN AMERICAN: 89 mL/min/{1.73_m2} (ref >=60)
GLUCOSE: 85 mg/dL (ref 65–99)
Globulin: 2.9 g/dL (calc) (ref 1.9–3.7)
Potassium: 4.3 mmol/L (ref 3.5–5.3)
Sodium: 135 mmol/L (ref 135–146)
TOTAL PROTEIN: 7.3 g/dL (ref 6.1–8.1)
Total Bilirubin: 0.6 mg/dL (ref 0.2–1.2)

## 2016-12-16 LAB — TSH: TSH: 1.38 mIU/L

## 2016-12-16 MED ORDER — LORCASERIN HCL ER 20 MG PO TB24: 20.0000 mg | ORAL_TABLET | Freq: Every day | ORAL | 1 refills | Status: DC

## 2016-12-16 NOTE — Progress Notes (Addendum)
Subjective:     Valerie Shaw is a 31 y.o. female and is here for a comprehensive physical exam. The patient reports no problems.  Social History   Socioeconomic History  . Marital status: Married    Spouse name: Not on file  . Number of children: Not on file  . Years of education: Not on file  . Highest education level: Not on file  Social Needs  . Financial resource strain: Not on file  . Food insecurity - worry: Not on file  . Food insecurity - inability: Not on file  . Transportation needs - medical: Not on file  . Transportation needs - non-medical: Not on file  Occupational History  . Not on file  Tobacco Use  . Smoking status: Current Every Day Smoker    Packs/day: 0.50    Types: Cigarettes  . Smokeless tobacco: Never Used  Substance and Sexual Activity  . Alcohol use: Yes  . Drug use: No  . Sexual activity: Not on file  Other Topics Concern  . Not on file  Social History Narrative   Exercising some.    Health Maintenance  Topic Date Due  . INFLUENZA VACCINE  12/16/2017 (Originally 09/11/2016)  . PAP SMEAR  07/12/2020  . TETANUS/TDAP  01/06/2023  . HIV Screening  Completed    The following portions of the patient's history were reviewed and updated as appropriate: allergies, current medications, past family history, past medical history, past social history, past surgical history and problem list.  Review of Systems A comprehensive review of systems was negative.   Objective:    BP 133/84   Pulse 78   Ht 5\' 4"  (1.626 m)   Wt 228 lb (103.4 kg)   SpO2 98%   BMI 39.14 kg/m  General appearance: alert and cooperative Head: Normocephalic, without obvious abnormality, atraumatic Eyes: conj clear, EOMI, PEERLA Ears: normal TM's and external ear canals both ears Nose: Nares normal. Septum midline. Mucosa normal. No drainage or sinus tenderness. Throat: lips, mucosa, and tongue normal; teeth and gums normal Neck: no adenopathy, no carotid bruit, no  JVD, supple, symmetrical, trachea midline and thyroid not enlarged, symmetric, no tenderness/mass/nodules Back: symmetric, no curvature. ROM normal. No CVA tenderness. Lungs: clear to auscultation bilaterally Breasts: normal appearance, no masses or tenderness Heart: regular rate and rhythm, S1, S2 normal, no murmur, click, rub or gallop Abdomen: soft, non-tender; bowel sounds normal; no masses,  no organomegaly Extremities: extremities normal, atraumatic, no cyanosis or edema Pulses: 2+ and symmetric Skin: Skin color, texture, turgor normal. No rashes or lesions red tag-like skin lesion on her mid back.  Will need to be removed likely by excision. Lymph nodes: Cervical, supraclavicular, and axillary nodes normal. Neurologic: Alert and oriented X 3, normal strength and tone. Normal symmetric reflexes. Normal coordination and gait    Assessment:    Healthy female exam.     Plan:     See After Visit Summary for Counseling Recommendations   Keep up a regular exercise program and make sure you are eating a healthy diet Try to eat 4 servings of dairy a day, or if you are lactose intolerant take a calcium with vitamin D daily.  Your vaccines are up to date.  She did decline the flu vaccine today.  She would also like to discuss medication for weight loss.  She does have a history of PCO S and has had significant difficulty losing weight.  At one point in time she was watching  what she was eating she was exercising consistently and was only able to lose a very small amount of weight.  She was feeling frustrated and so just stopped.  She is at a point now where she would like to consider medication.  We will send over prescription for Belviq.  Follow-up in 2 months.  Discussed the importance of doing this in conjunction with diet and exercise.  Encouraged her to calorie count.  He also has a mole on her mid back that is a mass tag like that she would like to have removed at some point.  Encouraged  her to schedule at her convenience.  Current weight: 228 pounds Goal weight: 180 pounds Dietary goals: Start calorie counting Exercise goals: Get back into the gym regularly. Medication: Belviq 20 mg Exar once a day Follow-up: 2 months Weight change:

## 2016-12-16 NOTE — Patient Instructions (Addendum)

## 2017-06-16 ENCOUNTER — Encounter: Payer: Self-pay | Admitting: Family Medicine

## 2017-06-16 ENCOUNTER — Ambulatory Visit (INDEPENDENT_AMBULATORY_CARE_PROVIDER_SITE_OTHER): Payer: Managed Care, Other (non HMO) | Admitting: Family Medicine

## 2017-06-16 ENCOUNTER — Ambulatory Visit (INDEPENDENT_AMBULATORY_CARE_PROVIDER_SITE_OTHER): Payer: 59

## 2017-06-16 VITALS — BP 136/75 | HR 80 | Ht 64.0 in | Wt 234.0 lb

## 2017-06-16 DIAGNOSIS — R9389 Abnormal findings on diagnostic imaging of other specified body structures: Secondary | ICD-10-CM

## 2017-06-16 DIAGNOSIS — R1030 Lower abdominal pain, unspecified: Secondary | ICD-10-CM | POA: Diagnosis not present

## 2017-06-16 DIAGNOSIS — N760 Acute vaginitis: Secondary | ICD-10-CM | POA: Diagnosis not present

## 2017-06-16 LAB — POCT URINALYSIS DIPSTICK
Bilirubin, UA: NEGATIVE
GLUCOSE UA: NEGATIVE
Ketones, UA: NEGATIVE
NITRITE UA: NEGATIVE
PROTEIN UA: NEGATIVE
Spec Grav, UA: 1.025 (ref 1.010–1.025)
Urobilinogen, UA: 0.2 E.U./dL
pH, UA: 5.5 (ref 5.0–8.0)

## 2017-06-16 LAB — WET PREP FOR TRICH, YEAST, CLUE
MICRO NUMBER: 90549108
SPECIMEN QUALITY 3963: ADEQUATE

## 2017-06-16 NOTE — Progress Notes (Signed)
Subjective:    Patient ID: Valerie Shaw, female    DOB: 02/23/1985, 32 y.o.   MRN: [id number]  HPI 32 year old female comes in today complaining of lower abdominal pain that actually started Saturday nights about 48 hours ago.  She does have a history of PCOS.  She does not member any trauma or injury or heavy lifting recently.  Though she was doing some heavy lifting about a month ago.  She did try some ibuprofen.  No fevers chills or sweats.  She has had a history of large cysts that had to be drained vaginally.  Is been a while since she is been in with an OB/GYN.  She says her last menstrual.  Was about 3 weeks ago.  She describes the lower abdominal pain as a tightness.  It goes across the middle.  Though she feels a little sore in her upper abdomen as well.  No significant nausea but just feels extremely tired.  She denies any change in her bowels such as diarrhea constipation.  She says the pain is worse with certain movements and with cough in fact she thought maybe she had pulled a muscle at first but it just does not seem to be consistent.  She also notes that last Tuesday she had some vaginal itching and irritation and treated herself with over-the-counter Vagisil.  That seemed to clear up but is still noticing a vaginal odor.  Smear is up-to-date.  Last performed in June 2017.     Review of Systems  BP 136/75   Pulse 80   Ht 5\' 4"  (1.626 m)   Wt 234 lb (106.1 kg)   SpO2 99%   BMI 40.17 kg/m     Allergies  Allergen Reactions  . Penicillins     REACTION: Mother is allergic so she has never been given any. [place]  October 16, 2007 10:01 AM    Past Medical History:  Diagnosis Date  . PCOS (polycystic ovarian syndrome)     Past Surgical History:  Procedure Laterality Date  . ARTHROSCOPIC REPAIR ACL    . CESAREAN SECTION      Social History   Socioeconomic History  . Marital status: Married    Spouse name: Not on file  . Number of children: Not  on file  . Years of education: Not on file  . Highest education level: Not on file  Occupational History  . Not on file  Social Needs  . Financial resource strain: Not on file  . Food insecurity:    Worry: Not on file    Inability: Not on file  . Transportation needs:    Medical: Not on file    Non-medical: Not on file  Tobacco Use  . Smoking status: Current Every Day Smoker    Packs/day: 0.50    Types: Cigarettes  . Smokeless tobacco: Never Used  Substance and Sexual Activity  . Alcohol use: Yes  . Drug use: No  . Sexual activity: Not on file  Lifestyle  . Physical activity:    Days per week: Not on file    Minutes per session: Not on file  . Stress: Not on file  Relationships  . Social connections:    Talks on phone: Not on file    Gets together: Not on file    Attends religious service: Not on file    Active member of club or organization: Not on file    Attends meetings of clubs or  organizations: Not on file    Relationship status: Not on file  . Intimate partner violence:    Fear of current or ex partner: Not on file    Emotionally abused: Not on file    Physically abused: Not on file    Forced sexual activity: Not on file  Other Topics Concern  . Not on file  Social History Narrative   Exercising some.     Family History  Problem Relation Age of Onset  . Hypothyroidism Mother   . Drug abuse Father   . Hypertension Unknown     Outpatient Encounter Medications as of 06/16/2017  Medication Sig  . [DISCONTINUED] Lorcaserin HCl ER (BELVIQ XR) 20 MG TB24 Take 20 mg daily by mouth.   No facility-administered encounter medications on file as of 06/16/2017.          Objective:   Physical Exam  Constitutional: She is oriented to person, place, and time. She appears well-developed and well-nourished.  HENT:  Head: Normocephalic and atraumatic.  Eyes: Conjunctivae and EOM are normal.  Cardiovascular: Normal rate.  Pulmonary/Chest: Effort normal.   Abdominal: Bowel sounds are normal. There is tenderness in the right upper quadrant, right lower quadrant, suprapubic area and left upper quadrant.  Genitourinary: Cervix exhibits discharge. Cervix exhibits no motion tenderness and no friability.  Neurological: She is alert and oriented to person, place, and time.  Skin: Skin is dry. No pallor.  Psychiatric: She has a normal mood and affect. Her behavior is normal.  Vitals reviewed.       Assessment & Plan:  Lower pelvic pain-we will refer for pelvic ultrasound.  Wet prep performed.  Urinalysis positive for blood and leukocytes will send for culture for confirmation.  Though she says this feels very different from when she is had bladder infections.  Vaginitis-we will do self wet prep and call with results once available.  Still having a persistent vaginal odor.

## 2017-06-16 NOTE — Addendum Note (Signed)
Addended by: Teddy Spike on: 06/16/2017 02:07 PM   Modules accepted: Orders

## 2017-06-16 NOTE — Addendum Note (Signed)
Addended by: Narda Rutherford on: 06/16/2017 02:51 PM   Modules accepted: Orders

## 2017-06-17 ENCOUNTER — Encounter: Payer: Self-pay | Admitting: Obstetrics

## 2017-06-17 ENCOUNTER — Encounter (HOSPITAL_COMMUNITY): Payer: Self-pay | Admitting: *Deleted

## 2017-06-17 ENCOUNTER — Inpatient Hospital Stay: Payer: 59 | Attending: Obstetrics | Admitting: Obstetrics

## 2017-06-17 ENCOUNTER — Encounter (HOSPITAL_COMMUNITY): Payer: Self-pay

## 2017-06-17 VITALS — BP 127/99 | HR 77 | Temp 98.4°F | Resp 20 | Ht 65.0 in | Wt 234.0 lb

## 2017-06-17 DIAGNOSIS — R1900 Intra-abdominal and pelvic swelling, mass and lump, unspecified site: Secondary | ICD-10-CM | POA: Diagnosis not present

## 2017-06-17 DIAGNOSIS — E282 Polycystic ovarian syndrome: Secondary | ICD-10-CM | POA: Insufficient documentation

## 2017-06-17 DIAGNOSIS — R102 Pelvic and perineal pain: Secondary | ICD-10-CM | POA: Diagnosis not present

## 2017-06-17 DIAGNOSIS — F17210 Nicotine dependence, cigarettes, uncomplicated: Secondary | ICD-10-CM | POA: Diagnosis not present

## 2017-06-17 NOTE — Progress Notes (Signed)
Consult Note: Gyn-Onc  Consult was requested by Dr. Beatrice Lecher  CC:  Chief Complaint  Patient presents with  . Pelvic mass    HPI: Valerie Shaw  is a very nice 32 y.o. P1  She has a long-standing history of infertility and PCOS.  3 days prior to presentation here she noticed some tightening in her abdomen and discomfort.  After 48 hours this did not improve and she scheduled a visit with her primary care provider after taking the day off of work yesterday.  Her primary care provider ordered an ultrasound of the abdomen and pelvis and the patient was performed 06/16/2017 revealing a 24 cm complex cyst presumed adnexal origin.  The ultrasound describes thick septations some of which appear vascular concerns for malignancy.  In addition there was a complex cyst in the left ovary felt to be consistent with a hemorrhagic cyst.  Patient's main complaints are tightness and fullness in the pelvis she denies nausea and vomiting.  She does have some pelvic and abdominal pain worse intermittently.  Note that she has had cysts in the past with her infertility treatment.  In 2011 she had vaginal drainage of presumably benign ovarian cyst.  Measurement of disease:  No results for input(s): CA125, CAN125, CEA, CA199, ESTRADIOL, INHBB in the last 8760 hours.  Invalid input(s): INHIBINA  . Pending further work-up   Radiology: . 06/16/2017-TVUS and pelvic ultrasound-[facility]-9 x 4.8 x 5.2 uterus normal in appearance 8.7 mm endometrium.  Right adnexa reveals a 24 x 15 x 17.7 cm cyst complex cyst multiloculated with thick septations heterogeneous echogenicity of the cystic compartments some areas with vascular septations concerning for malignancy.  Left ovary reveals a 4.3 x 2.1 x 3.8 cm possible hemorrhagic cyst.  Small amount of free fluid in the pelvis.    Oncologic History: Pending further work-up    No history exists.    Current Meds:  Outpatient Encounter Medications as of 06/17/2017   Medication Sig  . ibuprofen (ADVIL,MOTRIN) 200 MG tablet Take 800 mg by mouth every 8 (eight) hours as needed (for pain.).    No facility-administered encounter medications on file as of 06/17/2017.     Allergy:  Allergies  Allergen Reactions  . Penicillins Other (See Comments)    REACTION: Mother is allergic so she has never been given any. [place]  October 16, 2007 10:01 AM  Has patient had a PCN reaction causing immediate rash, facial/tongue/throat swelling, SOB or lightheadedness with hypotension: No Has patient had a PCN reaction causing severe rash involving mucus membranes or skin necrosis: No Has patient had a PCN reaction that required hospitalization: No Has patient had a PCN reaction occurring within the last 10 years: No If all of the above answe    Social Hx:   Social History   Socioeconomic History  . Marital status: Married    Spouse name: Not on file  . Number of children: Not on file  . Years of education: Not on file  . Highest education level: Not on file  Occupational History  . Not on file  Social Needs  . Financial resource strain: Not on file  . Food insecurity:    Worry: Not on file    Inability: Not on file  . Transportation needs:    Medical: Not on file    Non-medical: Not on file  Tobacco Use  . Smoking status: Current Every Day Smoker    Packs/day: 0.50    Types:  Cigarettes  . Smokeless tobacco: Never Used  Substance and Sexual Activity  . Alcohol use: Yes    Comment: 2 / month  . Drug use: No  . Sexual activity: Yes  Lifestyle  . Physical activity:    Days per week: Not on file    Minutes per session: Not on file  . Stress: Not on file  Relationships  . Social connections:    Talks on phone: Not on file    Gets together: Not on file    Attends religious service: Not on file    Active member of club or organization: Not on file    Attends meetings of clubs or organizations: Not on file    Relationship status: Not  on file  . Intimate partner violence:    Fear of current or ex partner: Not on file    Emotionally abused: Not on file    Physically abused: Not on file    Forced sexual activity: Not on file  Other Topics Concern  . Not on file  Social History Narrative   Exercising some.     Past Surgical Hx:  Past Surgical History:  Procedure Laterality Date  . ARTHROSCOPIC REPAIR ACL    . CESAREAN SECTION    . WISDOM TOOTH EXTRACTION      Past Medical Hx:  Past Medical History:  Diagnosis Date  . Infertility, female    clomid and stimulation meds in past; treated for years   . Leg fracture    history of   . PCOS (polycystic ovarian syndrome)   . Weight gain     Past Gynecological History:   GYNECOLOGIC HISTORY:  No LMP recorded. Menarche: 32 years old P 1 LMP 3 weeks prior to presentation.  States that occasionally she misses cycles occasionally they will be quite light and then she will have intermittent heavy cycles.  She never cycles twice in a month. Contraceptive oral contraceptives used for 2 years in her teenage years. HRT none  Last Pap June 2017 on record negative with negative HPV  Family Hx:  Family History  Problem Relation Age of Onset  . Hypothyroidism Mother   . Drug abuse Father   . Hypertension Unknown   . Cancer Paternal Uncle        unkown primary  . Lung cancer Maternal Grandfather        smoker    Review of Systems:  Review of Systems  Constitutional: Positive for fatigue and unexpected weight change.  Gastrointestinal: Positive for abdominal pain.  Genitourinary: Positive for pelvic pain.   Musculoskeletal: Positive for gait problem.  Neurological: Positive for gait problem.  All other systems reviewed and are negative.  Gait problem described as difficulty walking; discomfort  Vitals:  Blood pressure (!) 127/99, pulse 77, temperature 98.4 F (36.9 C), temperature source Oral, resp. rate 20, height 5\' 5"  (1.651 m), weight 234 lb (106.1 kg),  SpO2 100 %. Body mass index is 38.94 kg/m.   Physical Exam: ECOG PERFORMANCE STATUS: 1 - Symptomatic but completely ambulatory   General :  Well developed, 32 y.o., female in no apparent distress HEENT:  Normocephalic/atraumatic, symmetric, EOMI, eyelids normal Neck:   Supple, no masses.  Lymphatics:  No cervical/ submandibular/ supraclavicular/ infraclavicular/ inguinal adenopathy Respiratory:  Respirations unlabored, no use of accessory muscles CV:   Deferred Breast:  Deferred Musculoskeletal: No CVA tenderness, normal muscle strength. Abdomen:  Overweight soft, non-tender and nondistended. No evidence of hernia. No masses. Extremities:  No lymphedema,  no erythema, non-tender. Skin:   Normal inspection Neuro/Psych:  No focal motor deficit, no abnormal mental status. Normal gait. Normal affect. Alert and oriented to person, place, and time  Genito Urinary: Vulva: Normal external female genitalia.  Bladder/urethra: Urethral meatus normal in size and location. No lesions or   masses, well supported bladder Speculum exam: Vagina: No lesion, no discharge, no bleeding. Cervix: Normal appearing, no lesions. Bimanual exam:  Uterus: Normal size, mobile.  Adnexa: Masses palpable in the right mid abdomen feels mobile.   Rectovaginal:  Good tone, no masses, no cul de sac nodularity, no parametrial involvement or nodularity.  Oncologic Summary: 1. Pelvic mass   Assessment/Plan: 1. Counseling regarding pelvic mass o We discussed possible etiologies of the pelvic mass today. o Presumed etiology is adnexal in origin. o We discussed various diagnoses from benign to borderline to malignant 2. Management o Given the size and the complex nature of the mass of recommended surgical resection o The ultrasound describes vascular septations which I did review the images in the presence of the patient and her family today and agree with.  Therefore I think her surgery should be expedited and can  offer her resection this week. o Although we did discuss alternatives of observation that is not my recommendation o Patient asked me about draining the cyst through the vagina like she has had in previous times and I recommend against this process. 3. Surgical discussion o Recommendation is for laparotomy USO.  The mass cannot be delivered through a laparoscopic incision intact and I do not recommend intentional ntraoperative drainage. o In the case of a malignant diagnosis recommendation will be for TAH/BSO and staging patient agrees.  She understands she will lose fertility if this procedure is undertaken. o If borderline tumor is found at the time of surgery then my recommendation is to await final pathology before completing hysterectomy and removal of the other ovary however it is reasonable to proceed with staging in this case.  Patient understands and agrees. o The surgical sketch was reviewed including the risk benefits alternatives with the patient today in the presence of her mother and husband who accompany her.  She was given a copy of this document and one is placed in the chart the record. 4. Follow-up o She will be seen in the clinic 10 to 14 days postop to review the tissue report and for an early postoperative check. 5. Pending items o I will go ahead and order tumor markers in the event that this is borderline or malignant tumor. o Chest imaging is not ordered by me but will be deferred to anesthesia.  Obviously if there is a borderline or malignant component we will have to order additional imaging in the postoperative time period.   Valerie Caprice, MD  06/17/2017, 10:50 AM  Cc: Beatrice Lecher, MD (Referring and PCP)

## 2017-06-17 NOTE — Patient Instructions (Addendum)
Valerie Shaw  06/17/2017   Your procedure is scheduled on: 06/19/2017   Report to [facility] Main  Entrance             Report to admitting at    1145 AM    Call this number if you have problems the morning of surgery [phone]    Remember: Do not eat food  :After Midnight.may have clear liquids from 12 midnite until 01045am morning of surgery then nothing by mouth.    Eat a light diet the day before surgery.  Examples include soups, toast, broth, yogurt and mashed potatoes.  Things to avoid include carbonated beverages, raw fruits and vegetables and beans.     NO SOLID FOOD AFTER MIDNIGHT THE NIGHT PRIOR TO SURGERY. NOTHING BY MOUTH EXCEPT CLEAR LIQUIDS UNTIL 3 HOURS PRIOR TO [facility]GERY. PLEASE FINISH ENSURE DRINK PER SURGEON ORDER 3 HOURS PRIOR TO SCHEDULED SURGERY TIME WHICH NEEDS TO BE COMPLETED AT   1045am    Take these medicines the morning of surgery with A SIP OF WATER: none.                                 You may not have any metal on your body including hair pins and              piercings  Do not wear jewelry, make-up, lotions, powders or perfumes, deodorant             Do not wear nail polish.  Do not shave  48 hours prior to surgery.              Do not bring valuables to the hospital. [place].  Contacts, dentures or bridgework may not be worn into surgery.  Leave suitcase in the car. After surgery it may be brought to your room.       Special Instructions: coughing and deep breathing exercises, leg exercises               Please read over the following fact sheets you were given: _____________________________________________________________________                CLEAR LIQUID DIET   Foods Allowed                                                                     Foods Excluded  Coffee and tea, regular and decaf                             liquids that you cannot   Plain Jell-O in any flavor                                             see through such as: Fruit ices (not with fruit pulp)  milk, soups, orange juice  Iced Popsicles                                    All solid food                                  Cranberry, grape and apple juices Sports drinks like Gatorade Lightly seasoned clear broth or consume(fat free) Sugar, honey syrup  Sample Menu Breakfast                                Lunch                                     Supper Cranberry juice                    Beef broth                            Chicken broth Jell-O                                     Grape juice                           Apple juice Coffee or tea                        Jell-O                                      Popsicle                                                Coffee or tea                        Coffee or tea  _____________________________________________________________________   [facility] - Preparing for Surgery Before surgery, you can play an important role.  Because skin is not sterile, your skin needs to be as free of germs as possible.  You can reduce the number of germs on your skin by washing with CHG (chlorahexidine gluconate) soap before surgery.  CHG is an antiseptic cleaner which kills germs and bonds with the skin to continue killing germs even after washing. Please DO NOT use if you have an allergy to CHG or antibacterial soaps.  If your skin becomes reddened/irritated stop using the CHG and inform your nurse when you arrive at Short Stay. Do not shave (including legs and underarms) for at least 48 hours prior to the first CHG shower.  You may shave your face/neck. Please follow these instructions carefully:  1.  Shower with CHG Soap the night before surgery and the  morning of Surgery.  2.  If you choose to wash your hair, wash your hair first  as usual with your  normal  shampoo.  3.  After you shampoo,  rinse your hair and body thoroughly to remove the  shampoo.                           4.  Use CHG as you would any other liquid soap.  You can apply chg directly  to the skin and wash                       Gently with a scrungie or clean washcloth.  5.  Apply the CHG Soap to your body ONLY FROM THE NECK DOWN.   Do not use on face/ open                           Wound or open sores. Avoid contact with eyes, ears mouth and genitals (private parts).                       Wash face,  Genitals (private parts) with your normal soap.             6.  Wash thoroughly, paying special attention to the area where your surgery  will be performed.  7.  Thoroughly rinse your body with warm water from the neck down.  8.  DO NOT shower/wash with your normal soap after using and rinsing off  the CHG Soap.                9.  Pat yourself dry with a clean towel.            10.  Wear clean pajamas.            11.  Place clean sheets on your bed the night of your first shower and do not  sleep with pets. Day of Surgery : Do not apply any lotions/deodorants the morning of surgery.  Please wear clean clothes to the hospital/surgery center.  FAILURE TO FOLLOW THESE INSTRUCTIONS MAY RESULT IN THE CANCELLATION OF YOUR SURGERY PATIENT SIGNATURE_________________________________  NURSE SIGNATURE__________________________________  ________________________________________________________________________  WHAT IS A BLOOD TRANSFUSION? Blood Transfusion Information  A transfusion is the replacement of blood or some of its parts. Blood is made up of multiple cells which provide different functions.  Red blood cells carry oxygen and are used for blood loss replacement.  White blood cells fight against infection.  Platelets control bleeding.  Plasma helps clot blood.  Other blood products are available for specialized needs, such as hemophilia or other clotting disorders. BEFORE THE TRANSFUSION  Who gives blood for  transfusions?   Healthy volunteers who are fully evaluated to make sure their blood is safe. This is blood bank blood. Transfusion therapy is the safest it has ever been in the practice of medicine. Before blood is taken from a donor, a complete history is taken to make sure that person has no history of diseases nor engages in risky social behavior (examples are intravenous drug use or sexual activity with multiple partners). The donor's travel history is screened to minimize risk of transmitting infections, such as malaria. The donated blood is tested for signs of infectious diseases, such as HIV and hepatitis. The blood is then tested to be sure it is compatible with you in order to minimize the chance of a transfusion reaction. If you or a relative donates blood, this is often  done in anticipation of surgery and is not appropriate for emergency situations. It takes many days to process the donated blood. RISKS AND COMPLICATIONS Although transfusion therapy is very safe and saves many lives, the main dangers of transfusion include:   Getting an infectious disease.  Developing a transfusion reaction. This is an allergic reaction to something in the blood you were given. Every precaution is taken to prevent this. The decision to have a blood transfusion has been considered carefully by your caregiver before blood is given. Blood is not given unless the benefits outweigh the risks. AFTER THE TRANSFUSION  Right after receiving a blood transfusion, you will usually feel much better and more energetic. This is especially true if your red blood cells have gotten low (anemic). The transfusion raises the level of the red blood cells which carry oxygen, and this usually causes an energy increase.  The nurse administering the transfusion will monitor you carefully for complications. HOME CARE INSTRUCTIONS  No special instructions are needed after a transfusion. You may find your energy is better. Speak with  your caregiver about any limitations on activity for underlying diseases you may have. SEEK MEDICAL CARE IF:   Your condition is not improving after your transfusion.  You develop redness or irritation at the intravenous (IV) site. SEEK IMMEDIATE MEDICAL CARE IF:  Any of the following symptoms occur over the next 12 hours:  Shaking chills.  You have a temperature by mouth above 102 F (38.9 C), not controlled by medicine.  Chest, back, or muscle pain.  People around you feel you are not acting correctly or are confused.  Shortness of breath or difficulty breathing.  Dizziness and fainting.  You get a rash or develop hives.  You have a decrease in urine output.  Your urine turns a dark color or changes to pink, red, or brown. Any of the following symptoms occur over the next 10 days:  You have a temperature by mouth above 102 F (38.9 C), not controlled by medicine.  Shortness of breath.  Weakness after normal activity.  The white part of the eye turns yellow (jaundice).  You have a decrease in the amount of urine or are urinating less often.  Your urine turns a dark color or changes to pink, red, or brown. Document Released: 01/26/2000 Document Revised: 04/22/2011 Document Reviewed: 09/14/2007 ExitCare Patient Information 2014 [place].  _______________________________________________________________________  Incentive Spirometer  An incentive spirometer is a tool that can help keep your lungs clear and active. This tool measures how well you are filling your lungs with each breath. Taking long deep breaths may help reverse or decrease the chance of developing breathing (pulmonary) problems (especially infection) following:  A long period of time when you are unable to move or be active. BEFORE THE PROCEDURE   If the spirometer includes an indicator to show your best effort, your nurse or respiratory therapist will set it to a desired goal.  If possible,  sit up straight or lean slightly forward. Try not to slouch.  Hold the incentive spirometer in an upright position. INSTRUCTIONS FOR USE  1. Sit on the edge of your bed if possible, or sit up as far as you can in bed or on a chair. 2. Hold the incentive spirometer in an upright position. 3. Breathe out normally. 4. Place the mouthpiece in your mouth and seal your lips tightly around it. 5. Breathe in slowly and as deeply as possible, raising the piston or the ball toward  the top of the column. 6. Hold your breath for 3-5 seconds or for as long as possible. Allow the piston or ball to fall to the bottom of the column. 7. Remove the mouthpiece from your mouth and breathe out normally. 8. Rest for a few seconds and repeat Steps 1 through 7 at least 10 times every 1-2 hours when you are awake. Take your time and take a few normal breaths between deep breaths. 9. The spirometer may include an indicator to show your best effort. Use the indicator as a goal to work toward during each repetition. 10. After each set of 10 deep breaths, practice coughing to be sure your lungs are clear. If you have an incision (the cut made at the time of surgery), support your incision when coughing by placing a pillow or rolled up towels firmly against it. Once you are able to get out of bed, walk around indoors and cough well. You may stop using the incentive spirometer when instructed by your caregiver.  RISKS AND COMPLICATIONS  Take your time so you do not get dizzy or light-headed.  If you are in pain, you may need to take or ask for pain medication before doing incentive spirometry. It is harder to take a deep breath if you are having pain. AFTER USE  Rest and breathe slowly and easily.  It can be helpful to keep track of a log of your progress. Your caregiver can provide you with a simple table to help with this. If you are using the spirometer at home, follow these instructions: [place] IF:   You  are having difficultly using the spirometer.  You have trouble using the spirometer as often as instructed.  Your pain medication is not giving enough relief while using the spirometer.  You develop fever of 100.5 F (38.1 C) or higher. SEEK IMMEDIATE MEDICAL CARE IF:   You cough up bloody sputum that had not been present before.  You develop fever of 102 F (38.9 C) or greater.  You develop worsening pain at or near the incision site. MAKE SURE YOU:   Understand these instructions.  Will watch your condition.  Will get help right away if you are not doing well or get worse. Document Released: 06/10/2006 Document Revised: 04/22/2011 Document Reviewed: 08/11/2006 [facility] Patient Information 2014 [place], [place].   ________________________________________________________________________

## 2017-06-17 NOTE — H&P (View-Only) (Signed)
Consult Note: Gyn-Onc  Consult was requested by Dr. Beatrice Lecher  CC:  Chief Complaint  Patient presents with  . Pelvic mass    HPI: Ms. Valerie Shaw  is a very nice 32 y.o. P1  She has a long-standing history of infertility and PCOS.  3 days prior to presentation here she noticed some tightening in her abdomen and discomfort.  After 48 hours this did not improve and she scheduled a visit with her primary care provider after taking the day off of work yesterday.  Her primary care provider ordered an ultrasound of the abdomen and pelvis and the patient was performed 06/16/2017 revealing a 24 cm complex cyst presumed adnexal origin.  The ultrasound describes thick septations some of which appear vascular concerns for malignancy.  In addition there was a complex cyst in the left ovary felt to be consistent with a hemorrhagic cyst.  Patient's main complaints are tightness and fullness in the pelvis she denies nausea and vomiting.  She does have some pelvic and abdominal pain worse intermittently.  Note that she has had cysts in the past with her infertility treatment.  In 2011 she had vaginal drainage of presumably benign ovarian cyst.  Measurement of disease:  No results for input(s): CA125, CAN125, CEA, CA199, ESTRADIOL, INHBB in the last 8760 hours.  Invalid input(s): INHIBINA  . Pending further work-up   Radiology: . 06/16/2017-TVUS and pelvic ultrasound-[facility]-9 x 4.8 x 5.2 uterus normal in appearance 8.7 mm endometrium.  Right adnexa reveals a 24 x 15 x 17.7 cm cyst complex cyst multiloculated with thick septations heterogeneous echogenicity of the cystic compartments some areas with vascular septations concerning for malignancy.  Left ovary reveals a 4.3 x 2.1 x 3.8 cm possible hemorrhagic cyst.  Small amount of free fluid in the pelvis.    Oncologic History: Pending further work-up    No history exists.    Current Meds:  Outpatient Encounter Medications as of 06/17/2017   Medication Sig  . ibuprofen (ADVIL,MOTRIN) 200 MG tablet Take 800 mg by mouth every 8 (eight) hours as needed (for pain.).    No facility-administered encounter medications on file as of 06/17/2017.     Allergy:  Allergies  Allergen Reactions  . Penicillins Other (See Comments)    REACTION: Mother is allergic so she has never been given any. [place]  October 16, 2007 10:01 AM  Has patient had a PCN reaction causing immediate rash, facial/tongue/throat swelling, SOB or lightheadedness with hypotension: No Has patient had a PCN reaction causing severe rash involving mucus membranes or skin necrosis: No Has patient had a PCN reaction that required hospitalization: No Has patient had a PCN reaction occurring within the last 10 years: No If all of the above answe    Social Hx:   Social History   Socioeconomic History  . Marital status: Married    Spouse name: Not on file  . Number of children: Not on file  . Years of education: Not on file  . Highest education level: Not on file  Occupational History  . Not on file  Social Needs  . Financial resource strain: Not on file  . Food insecurity:    Worry: Not on file    Inability: Not on file  . Transportation needs:    Medical: Not on file    Non-medical: Not on file  Tobacco Use  . Smoking status: Current Every Day Smoker    Packs/day: 0.50    Types:  Cigarettes  . Smokeless tobacco: Never Used  Substance and Sexual Activity  . Alcohol use: Yes    Comment: 2 / month  . Drug use: No  . Sexual activity: Yes  Lifestyle  . Physical activity:    Days per week: Not on file    Minutes per session: Not on file  . Stress: Not on file  Relationships  . Social connections:    Talks on phone: Not on file    Gets together: Not on file    Attends religious service: Not on file    Active member of club or organization: Not on file    Attends meetings of clubs or organizations: Not on file    Relationship status: Not  on file  . Intimate partner violence:    Fear of current or ex partner: Not on file    Emotionally abused: Not on file    Physically abused: Not on file    Forced sexual activity: Not on file  Other Topics Concern  . Not on file  Social History Narrative   Exercising some.     Past Surgical Hx:  Past Surgical History:  Procedure Laterality Date  . ARTHROSCOPIC REPAIR ACL    . CESAREAN SECTION    . WISDOM TOOTH EXTRACTION      Past Medical Hx:  Past Medical History:  Diagnosis Date  . Infertility, female    clomid and stimulation meds in past; treated for years   . Leg fracture    history of   . PCOS (polycystic ovarian syndrome)   . Weight gain     Past Gynecological History:   GYNECOLOGIC HISTORY:  No LMP recorded. Menarche: 32 years old P 1 LMP 3 weeks prior to presentation.  States that occasionally she misses cycles occasionally they will be quite light and then she will have intermittent heavy cycles.  She never cycles twice in a month. Contraceptive oral contraceptives used for 2 years in her teenage years. HRT none  Last Pap June 2017 on record negative with negative HPV  Family Hx:  Family History  Problem Relation Age of Onset  . Hypothyroidism Mother   . Drug abuse Father   . Hypertension Unknown   . Cancer Paternal Uncle        unkown primary  . Lung cancer Maternal Grandfather        smoker    Review of Systems:  Review of Systems  Constitutional: Positive for fatigue and unexpected weight change.  Gastrointestinal: Positive for abdominal pain.  Genitourinary: Positive for pelvic pain.   Musculoskeletal: Positive for gait problem.  Neurological: Positive for gait problem.  All other systems reviewed and are negative.  Gait problem described as difficulty walking; discomfort  Vitals:  Blood pressure (!) 127/99, pulse 77, temperature 98.4 F (36.9 C), temperature source Oral, resp. rate 20, height 5\' 5"  (1.651 m), weight 234 lb (106.1 kg),  SpO2 100 %. Body mass index is 38.94 kg/m.   Physical Exam: ECOG PERFORMANCE STATUS: 1 - Symptomatic but completely ambulatory   General :  Well developed, 32 y.o., female in no apparent distress HEENT:  Normocephalic/atraumatic, symmetric, EOMI, eyelids normal Neck:   Supple, no masses.  Lymphatics:  No cervical/ submandibular/ supraclavicular/ infraclavicular/ inguinal adenopathy Respiratory:  Respirations unlabored, no use of accessory muscles CV:   Deferred Breast:  Deferred Musculoskeletal: No CVA tenderness, normal muscle strength. Abdomen:  Overweight soft, non-tender and nondistended. No evidence of hernia. No masses. Extremities:  No lymphedema,  no erythema, non-tender. Skin:   Normal inspection Neuro/Psych:  No focal motor deficit, no abnormal mental status. Normal gait. Normal affect. Alert and oriented to person, place, and time  Genito Urinary: Vulva: Normal external female genitalia.  Bladder/urethra: Urethral meatus normal in size and location. No lesions or   masses, well supported bladder Speculum exam: Vagina: No lesion, no discharge, no bleeding. Cervix: Normal appearing, no lesions. Bimanual exam:  Uterus: Normal size, mobile.  Adnexa: Masses palpable in the right mid abdomen feels mobile.   Rectovaginal:  Good tone, no masses, no cul de sac nodularity, no parametrial involvement or nodularity.  Oncologic Summary: 1. Pelvic mass   Assessment/Plan: 1. Counseling regarding pelvic mass o We discussed possible etiologies of the pelvic mass today. o Presumed etiology is adnexal in origin. o We discussed various diagnoses from benign to borderline to malignant 2. Management o Given the size and the complex nature of the mass of recommended surgical resection o The ultrasound describes vascular septations which I did review the images in the presence of the patient and her family today and agree with.  Therefore I think her surgery should be expedited and can  offer her resection this week. o Although we did discuss alternatives of observation that is not my recommendation o Patient asked me about draining the cyst through the vagina like she has had in previous times and I recommend against this process. 3. Surgical discussion o Recommendation is for laparotomy USO.  The mass cannot be delivered through a laparoscopic incision intact and I do not recommend intentional ntraoperative drainage. o In the case of a malignant diagnosis recommendation will be for TAH/BSO and staging patient agrees.  She understands she will lose fertility if this procedure is undertaken. o If borderline tumor is found at the time of surgery then my recommendation is to await final pathology before completing hysterectomy and removal of the other ovary however it is reasonable to proceed with staging in this case.  Patient understands and agrees. o The surgical sketch was reviewed including the risk benefits alternatives with the patient today in the presence of her mother and husband who accompany her.  She was given a copy of this document and one is placed in the chart the record. 4. Follow-up o She will be seen in the clinic 10 to 14 days postop to review the tissue report and for an early postoperative check. 5. Pending items o I will go ahead and order tumor markers in the event that this is borderline or malignant tumor. o Chest imaging is not ordered by me but will be deferred to anesthesia.  Obviously if there is a borderline or malignant component we will have to order additional imaging in the postoperative time period.   Isabel Caprice, MD  06/17/2017, 10:50 AM  Cc: Beatrice Lecher, MD (Referring and PCP)

## 2017-06-17 NOTE — Patient Instructions (Signed)
Preparing for your Surgery  Plan for surgery on Jun 19, 2017 with Dr. Precious Haws at [facility] will be scheduled for an exploratory laparotomy, unilateral salpingo-oophorectomy, possible total abdominal hysterectomy, possible bilateral salpingo-oophorectomy, possible staging.  Pre-operative Testing -You will receive a phone call from presurgical testing at [facility] to arrange for a pre-operative testing appointment before your surgery.  This appointment normally occurs one to two weeks before your scheduled surgery.   -Bring your insurance card, copy of an advanced directive if applicable, medication list  -At that visit, you will be asked to sign a consent for a possible blood transfusion in case a transfusion becomes necessary during surgery.  The need for a blood transfusion is rare but having consent is a necessary part of your care.     -You should not be taking blood thinners or aspirin at least ten days prior to surgery unless instructed by your surgeon.  Day Before Surgery at [facility] will be asked to take in a light diet the day before surgery.  Avoid carbonated beverages.  You will be advised to have nothing to eat or drink after midnight the evening before.    Eat a light diet the day before surgery.  Examples including soups, broths, toast, yogurt, mashed potatoes.  Things to avoid include carbonated beverages (fizzy beverages), raw fruits and raw vegetables, or beans.   If your bowels are filled with gas, your surgeon will have difficulty visualizing your pelvic organs which increases your surgical risks.  Your role in recovery Your role is to become active as soon as directed by your doctor, while still giving yourself time to heal.  Rest when you feel tired. You will be asked to do the following in order to speed your recovery:  - Cough and breathe deeply. This helps toclear and expand your lungs and can prevent pneumonia. You may be  given a spirometer to practice deep breathing. A staff member will show you how to use the spirometer. - Do mild physical activity. Walking or moving your legs help your circulation and body functions return to normal. A staff member will help you when you try to walk and will provide you with simple exercises. Do not try to get up or walk alone the first time. - Actively manage your pain. Managing your pain lets you move in comfort. We will ask you to rate your pain on a scale of zero to 10. It is your responsibility to tell your doctor or nurse where and how much you hurt so your pain can be treated.  Special Considerations -If you are diabetic, you may be placed on insulin after surgery to have closer control over your blood sugars to promote healing and recovery.  This does not mean that you will be discharged on insulin.  If applicable, your oral antidiabetics will be resumed when you are tolerating a solid diet.  -Your final pathology results from surgery should be available by the Friday after surgery and the results will be relayed to you when available.  -Dr. Lahoma Crocker is the Surgeon that assists your GYN Oncologist with surgery.  The next day after your surgery you will either see your GYN Oncologist or Dr. Lahoma Crocker.   Blood Transfusion Information WHAT IS A BLOOD TRANSFUSION? A transfusion is the replacement of blood or some of its parts. Blood is made up of multiple cells which provide different functions.  Red blood cells carry oxygen and are  used for blood loss replacement.  White blood cells fight against infection.  Platelets control bleeding.  Plasma helps clot blood.  Other blood products are available for specialized needs, such as hemophilia or other clotting disorders. BEFORE THE TRANSFUSION  Who gives blood for transfusions?   You may be able to donate blood to be used at a later date on yourself (autologous donation).  Relatives can be asked to  donate blood. This is generally not any safer than if you have received blood from a stranger. The same precautions are taken to ensure safety when a relative's blood is donated.  Healthy volunteers who are fully evaluated to make sure their blood is safe. This is blood bank blood. Transfusion therapy is the safest it has ever been in the practice of medicine. Before blood is taken from a donor, a complete history is taken to make sure that person has no history of diseases nor engages in risky social behavior (examples are intravenous drug use or sexual activity with multiple partners). The donor's travel history is screened to minimize risk of transmitting infections, such as malaria. The donated blood is tested for signs of infectious diseases, such as HIV and hepatitis. The blood is then tested to be sure it is compatible with you in order to minimize the chance of a transfusion reaction. If you or a relative donates blood, this is often done in anticipation of surgery and is not appropriate for emergency situations. It takes many days to process the donated blood. RISKS AND COMPLICATIONS Although transfusion therapy is very safe and saves many lives, the main dangers of transfusion include:   Getting an infectious disease.  Developing a transfusion reaction. This is an allergic reaction to something in the blood you were given. Every precaution is taken to prevent this. The decision to have a blood transfusion has been considered carefully by your caregiver before blood is given. Blood is not given unless the benefits outweigh the risks.

## 2017-06-18 ENCOUNTER — Encounter (HOSPITAL_COMMUNITY): Payer: Self-pay

## 2017-06-18 ENCOUNTER — Other Ambulatory Visit: Payer: Self-pay

## 2017-06-18 ENCOUNTER — Encounter (HOSPITAL_COMMUNITY)
Admission: RE | Admit: 2017-06-18 | Discharge: 2017-06-18 | Disposition: A | Discharge status: Home / Self Care | Payer: 59 | Source: Ambulatory Visit | Attending: Obstetrics | Admitting: Obstetrics

## 2017-06-18 ENCOUNTER — Telehealth: Payer: Self-pay | Admitting: *Deleted

## 2017-06-18 HISTORY — DX: Headache, unspecified: R51.9

## 2017-06-18 HISTORY — DX: Adverse effect of unspecified anesthetic, initial encounter: T41.45XA

## 2017-06-18 HISTORY — DX: Headache: R51

## 2017-06-18 HISTORY — DX: Unspecified fracture of unspecified foot, initial encounter for closed fracture: S92.909A

## 2017-06-18 HISTORY — DX: Other complications of anesthesia, initial encounter: T88.59XA

## 2017-06-18 LAB — URINALYSIS, ROUTINE W REFLEX MICROSCOPIC
BILIRUBIN URINE: NEGATIVE
Bacteria, UA: NONE SEEN
Glucose, UA: NEGATIVE mg/dL
Ketones, ur: NEGATIVE mg/dL
Leukocytes, UA: NEGATIVE
NITRITE: NEGATIVE
Protein, ur: NEGATIVE mg/dL
Specific Gravity, Urine: 1.017 (ref 1.005–1.030)
pH: 6 (ref 5.0–8.0)

## 2017-06-18 LAB — COMPREHENSIVE METABOLIC PANEL
ALT: 13 U/L — ABNORMAL LOW (ref 14–54)
AST: 19 U/L (ref 15–41)
Albumin: 4.1 g/dL (ref 3.5–5.0)
Alkaline Phosphatase: 75 U/L (ref 38–126)
Anion gap: 9 (ref 5–15)
BUN: 14 mg/dL (ref 6–20)
CHLORIDE: 107 mmol/L (ref 101–111)
CO2: 22 mmol/L (ref 22–32)
CREATININE: 0.81 mg/dL (ref 0.44–1.00)
Calcium: 9.2 mg/dL (ref 8.9–10.3)
Glucose, Bld: 93 mg/dL (ref 65–99)
POTASSIUM: 4.6 mmol/L (ref 3.5–5.1)
Sodium: 138 mmol/L (ref 135–145)
Total Bilirubin: 0.7 mg/dL (ref 0.3–1.2)
Total Protein: 7.9 g/dL (ref 6.5–8.1)

## 2017-06-18 LAB — CBC
HCT: 43 % (ref 36.0–46.0)
Hemoglobin: 14.5 g/dL (ref 12.0–15.0)
MCH: 30.3 pg (ref 26.0–34.0)
MCHC: 33.7 g/dL (ref 30.0–36.0)
MCV: 89.8 fL (ref 78.0–100.0)
PLATELETS: 246 10*3/uL (ref 150–400)
RBC: 4.79 MIL/uL (ref 3.87–5.11)
RDW: 12.9 % (ref 11.5–15.5)
WBC: 7.8 10*3/uL (ref 4.0–10.5)

## 2017-06-18 LAB — PREGNANCY, URINE: Preg Test, Ur: NEGATIVE

## 2017-06-18 LAB — URINE CULTURE
MICRO NUMBER: 90551381
SPECIMEN QUALITY:: ADEQUATE

## 2017-06-18 LAB — ABO/RH: ABO/RH(D): B POS

## 2017-06-18 NOTE — Telephone Encounter (Signed)
Attempted to contact the patient regarding her post op appt. Unable to leave a message due to the patient's mail box being full.

## 2017-06-19 ENCOUNTER — Encounter (HOSPITAL_COMMUNITY): Payer: Self-pay | Admitting: *Deleted

## 2017-06-19 ENCOUNTER — Ambulatory Visit (HOSPITAL_COMMUNITY): Payer: 59 | Admitting: Anesthesiology

## 2017-06-19 ENCOUNTER — Encounter (HOSPITAL_COMMUNITY)
Admission: AD | Disposition: A | Discharge status: Home / Self Care | Payer: Self-pay | Source: Ambulatory Visit | Attending: Obstetrics

## 2017-06-19 ENCOUNTER — Inpatient Hospital Stay (HOSPITAL_COMMUNITY)
Admission: AD | Admit: 2017-06-19 | Discharge: 2017-06-21 | DRG: 743 | Disposition: A | Discharge status: Home / Self Care | Payer: 59 | Source: Ambulatory Visit | Attending: Obstetrics | Admitting: Obstetrics

## 2017-06-19 DIAGNOSIS — Z01818 Encounter for other preprocedural examination: Secondary | ICD-10-CM | POA: Diagnosis not present

## 2017-06-19 DIAGNOSIS — N979 Female infertility, unspecified: Secondary | ICD-10-CM | POA: Diagnosis present

## 2017-06-19 DIAGNOSIS — Z880 Allergy status to penicillin: Secondary | ICD-10-CM | POA: Diagnosis not present

## 2017-06-19 DIAGNOSIS — N83202 Unspecified ovarian cyst, left side: Secondary | ICD-10-CM | POA: Diagnosis present

## 2017-06-19 DIAGNOSIS — F17210 Nicotine dependence, cigarettes, uncomplicated: Secondary | ICD-10-CM | POA: Diagnosis present

## 2017-06-19 DIAGNOSIS — E669 Obesity, unspecified: Secondary | ICD-10-CM | POA: Diagnosis present

## 2017-06-19 DIAGNOSIS — D270 Benign neoplasm of right ovary: Secondary | ICD-10-CM | POA: Diagnosis present

## 2017-06-19 DIAGNOSIS — Z801 Family history of malignant neoplasm of trachea, bronchus and lung: Secondary | ICD-10-CM | POA: Diagnosis not present

## 2017-06-19 DIAGNOSIS — Z6838 Body mass index (BMI) 38.0-38.9, adult: Secondary | ICD-10-CM | POA: Diagnosis not present

## 2017-06-19 DIAGNOSIS — E282 Polycystic ovarian syndrome: Secondary | ICD-10-CM | POA: Diagnosis present

## 2017-06-19 DIAGNOSIS — Z809 Family history of malignant neoplasm, unspecified: Secondary | ICD-10-CM

## 2017-06-19 DIAGNOSIS — Z01812 Encounter for preprocedural laboratory examination: Secondary | ICD-10-CM

## 2017-06-19 DIAGNOSIS — R1900 Intra-abdominal and pelvic swelling, mass and lump, unspecified site: Secondary | ICD-10-CM | POA: Diagnosis present

## 2017-06-19 DIAGNOSIS — N838 Other noninflammatory disorders of ovary, fallopian tube and broad ligament: Secondary | ICD-10-CM

## 2017-06-19 HISTORY — PX: LAPAROSCOPIC BILATERAL SALPINGO OOPHERECTOMY: SHX5890

## 2017-06-19 LAB — CA 125: Cancer Antigen (CA) 125: 70.6 U/mL — ABNORMAL HIGH (ref 0.0–38.1)

## 2017-06-19 LAB — TYPE AND SCREEN
ABO/RH(D): B POS
Antibody Screen: NEGATIVE

## 2017-06-19 LAB — CEA: CEA: 2.7 ng/mL (ref 0.0–4.7)

## 2017-06-19 LAB — CANCER ANTIGEN 19-9: CA 19-9: 5 U/mL (ref 0–35)

## 2017-06-19 SURGERY — SALPINGO-OOPHORECTOMY, BILATERAL, LAPAROSCOPIC
Anesthesia: General

## 2017-06-19 MED ORDER — PROPOFOL 10 MG/ML IV BOLUS
INTRAVENOUS | Status: DC | PRN
  Administered 2017-06-19: 200 mg via INTRAVENOUS

## 2017-06-19 MED ORDER — HYDROMORPHONE HCL 1 MG/ML IJ SOLN
0.5000 mg | INTRAMUSCULAR | Status: DC | PRN
  Administered 2017-06-19 – 2017-06-20 (×3): 0.5 mg via INTRAVENOUS
  Filled 2017-06-19 (×4): qty 0.5

## 2017-06-19 MED ORDER — ROCURONIUM BROMIDE 10 MG/ML (PF) SYRINGE
PREFILLED_SYRINGE | INTRAVENOUS | Status: DC | PRN
  Administered 2017-06-19 (×2): 10 mg via INTRAVENOUS
  Administered 2017-06-19: 60 mg via INTRAVENOUS

## 2017-06-19 MED ORDER — KETOROLAC TROMETHAMINE 30 MG/ML IJ SOLN: 30.0000 mg | Freq: Four times a day (QID) | INTRAMUSCULAR | Status: AC

## 2017-06-19 MED ORDER — BUPIVACAINE-EPINEPHRINE (PF) 0.5% -1:200000 IJ SOLN
INTRAMUSCULAR | Status: DC | PRN
  Administered 2017-06-19 (×2): 20 mL via PERINEURAL

## 2017-06-19 MED ORDER — ONDANSETRON HCL 4 MG/2ML IJ SOLN: 4.0000 mg | Freq: Four times a day (QID) | INTRAMUSCULAR | Status: DC | PRN

## 2017-06-19 MED ORDER — DEXAMETHASONE SODIUM PHOSPHATE 10 MG/ML IJ SOLN
INTRAMUSCULAR | Status: AC
  Filled 2017-06-19: qty 1

## 2017-06-19 MED ORDER — NON FORMULARY: 1.0000 [IU] | Freq: Three times a day (TID) | Status: DC

## 2017-06-19 MED ORDER — DEXAMETHASONE SODIUM PHOSPHATE 4 MG/ML IJ SOLN: 4.0000 mg | INTRAMUSCULAR | Status: DC

## 2017-06-19 MED ORDER — DEXAMETHASONE SODIUM PHOSPHATE 10 MG/ML IJ SOLN
INTRAMUSCULAR | Status: DC | PRN
  Administered 2017-06-19: 10 mg via INTRAVENOUS

## 2017-06-19 MED ORDER — FENTANYL CITRATE (PF) 100 MCG/2ML IJ SOLN
50.0000 ug | INTRAMUSCULAR | Status: DC
  Administered 2017-06-19: 100 ug via INTRAVENOUS
  Filled 2017-06-19: qty 2

## 2017-06-19 MED ORDER — OXYCODONE HCL 5 MG PO TABS
5.0000 mg | ORAL_TABLET | ORAL | Status: DC | PRN
  Administered 2017-06-20 – 2017-06-21 (×6): 5 mg via ORAL
  Filled 2017-06-19 (×7): qty 1

## 2017-06-19 MED ORDER — CHEWING GUM (ORBIT) SUGAR FREE
1.0000 | CHEWING_GUM | Freq: Three times a day (TID) | ORAL | Status: DC
  Administered 2017-06-20 – 2017-06-21 (×4): 1 via ORAL
  Filled 2017-06-19 (×2): qty 1

## 2017-06-19 MED ORDER — DOCUSATE SODIUM 100 MG PO CAPS
100.0000 mg | ORAL_CAPSULE | Freq: Two times a day (BID) | ORAL | Status: DC
  Administered 2017-06-19 – 2017-06-21 (×4): 100 mg via ORAL
  Filled 2017-06-19 (×4): qty 1

## 2017-06-19 MED ORDER — PREGABALIN 75 MG PO CAPS
75.0000 mg | ORAL_CAPSULE | Freq: Two times a day (BID) | ORAL | Status: DC
  Administered 2017-06-20 – 2017-06-21 (×3): 75 mg via ORAL
  Filled 2017-06-19 (×3): qty 1

## 2017-06-19 MED ORDER — SCOPOLAMINE 1 MG/3DAYS TD PT72
MEDICATED_PATCH | TRANSDERMAL | Status: AC
  Filled 2017-06-19: qty 1

## 2017-06-19 MED ORDER — CELECOXIB 200 MG PO CAPS
400.0000 mg | ORAL_CAPSULE | ORAL | Status: AC
  Administered 2017-06-19: 400 mg via ORAL
  Filled 2017-06-19: qty 2

## 2017-06-19 MED ORDER — SUGAMMADEX SODIUM 500 MG/5ML IV SOLN
INTRAVENOUS | Status: AC
  Filled 2017-06-19: qty 5

## 2017-06-19 MED ORDER — CEFAZOLIN SODIUM-DEXTROSE 2-4 GM/100ML-% IV SOLN
2.0000 g | INTRAVENOUS | Status: AC
  Administered 2017-06-19: 2 g via INTRAVENOUS
  Filled 2017-06-19: qty 100

## 2017-06-19 MED ORDER — METHOCARBAMOL 1000 MG/10ML IJ SOLN
500.0000 mg | Freq: Once | INTRAVENOUS | Status: AC
  Administered 2017-06-19: 500 mg via INTRAVENOUS
  Filled 2017-06-19: qty 550

## 2017-06-19 MED ORDER — PROPOFOL 10 MG/ML IV BOLUS
INTRAVENOUS | Status: AC
  Filled 2017-06-19: qty 20

## 2017-06-19 MED ORDER — SODIUM CHLORIDE 0.9 % IJ SOLN
INTRAMUSCULAR | Status: AC
  Filled 2017-06-19: qty 10

## 2017-06-19 MED ORDER — HYDROMORPHONE HCL 1 MG/ML IJ SOLN
0.2500 mg | INTRAMUSCULAR | Status: DC | PRN
  Administered 2017-06-19 (×3): 0.5 mg via INTRAVENOUS

## 2017-06-19 MED ORDER — MIDAZOLAM HCL 2 MG/2ML IJ SOLN
INTRAMUSCULAR | Status: AC
  Administered 2017-06-19: 0.5 mg via INTRAVENOUS
  Filled 2017-06-19: qty 2

## 2017-06-19 MED ORDER — SUFENTANIL CITRATE 50 MCG/ML IV SOLN
INTRAVENOUS | Status: DC | PRN
  Administered 2017-06-19 (×2): 10 ug via INTRAVENOUS
  Administered 2017-06-19 (×2): 5 ug via INTRAVENOUS
  Administered 2017-06-19: 20 ug via INTRAVENOUS

## 2017-06-19 MED ORDER — FENTANYL CITRATE (PF) 100 MCG/2ML IJ SOLN
INTRAMUSCULAR | Status: DC | PRN
  Administered 2017-06-19 (×4): 50 ug via INTRAVENOUS

## 2017-06-19 MED ORDER — IBUPROFEN 200 MG PO TABS
600.0000 mg | ORAL_TABLET | Freq: Four times a day (QID) | ORAL | Status: DC
  Administered 2017-06-20 – 2017-06-21 (×2): 600 mg via ORAL
  Filled 2017-06-19 (×2): qty 3

## 2017-06-19 MED ORDER — KETAMINE HCL 10 MG/ML IJ SOLN
INTRAMUSCULAR | Status: AC
  Filled 2017-06-19: qty 1

## 2017-06-19 MED ORDER — ONDANSETRON HCL 4 MG PO TABS: 4.0000 mg | ORAL_TABLET | Freq: Four times a day (QID) | ORAL | Status: DC | PRN

## 2017-06-19 MED ORDER — FENTANYL CITRATE (PF) 100 MCG/2ML IJ SOLN
INTRAMUSCULAR | Status: AC
  Filled 2017-06-19: qty 2

## 2017-06-19 MED ORDER — LIP MEDEX EX OINT
TOPICAL_OINTMENT | CUTANEOUS | Status: AC
  Filled 2017-06-19: qty 7

## 2017-06-19 MED ORDER — LACTATED RINGERS IV SOLN
INTRAVENOUS | Status: DC
  Administered 2017-06-19 (×2): via INTRAVENOUS

## 2017-06-19 MED ORDER — HYDROMORPHONE HCL 1 MG/ML IJ SOLN
0.2500 mg | INTRAMUSCULAR | Status: DC | PRN
  Administered 2017-06-19 (×4): 0.5 mg via INTRAVENOUS

## 2017-06-19 MED ORDER — KETOROLAC TROMETHAMINE 30 MG/ML IJ SOLN: 30.0000 mg | Freq: Once | INTRAMUSCULAR | Status: DC

## 2017-06-19 MED ORDER — 0.9 % SODIUM CHLORIDE (POUR BTL) OPTIME
TOPICAL | Status: DC | PRN
  Administered 2017-06-19: 2000 mL

## 2017-06-19 MED ORDER — ROCURONIUM BROMIDE 10 MG/ML (PF) SYRINGE
PREFILLED_SYRINGE | INTRAVENOUS | Status: AC
  Filled 2017-06-19: qty 5

## 2017-06-19 MED ORDER — KETAMINE HCL 10 MG/ML IJ SOLN
INTRAMUSCULAR | Status: DC | PRN
  Administered 2017-06-19 (×2): 20 mg via INTRAVENOUS
  Administered 2017-06-19: 30 mg via INTRAVENOUS

## 2017-06-19 MED ORDER — HYDROMORPHONE HCL 1 MG/ML IJ SOLN
INTRAMUSCULAR | Status: AC
  Administered 2017-06-19: 0.5 mg via INTRAVENOUS
  Filled 2017-06-19: qty 1

## 2017-06-19 MED ORDER — GABAPENTIN 300 MG PO CAPS
300.0000 mg | ORAL_CAPSULE | ORAL | Status: AC
  Administered 2017-06-19: 300 mg via ORAL
  Filled 2017-06-19: qty 1

## 2017-06-19 MED ORDER — SUGAMMADEX SODIUM 200 MG/2ML IV SOLN
INTRAVENOUS | Status: DC | PRN
  Administered 2017-06-19: 250 mg via INTRAVENOUS

## 2017-06-19 MED ORDER — LIDOCAINE 2% (20 MG/ML) 5 ML SYRINGE
INTRAMUSCULAR | Status: DC | PRN
  Administered 2017-06-19: 100 mg via INTRAVENOUS

## 2017-06-19 MED ORDER — MIDAZOLAM HCL 2 MG/2ML IJ SOLN
0.5000 mg | Freq: Once | INTRAMUSCULAR | Status: AC | PRN
  Administered 2017-06-19: 0.5 mg via INTRAVENOUS

## 2017-06-19 MED ORDER — ACETAMINOPHEN 500 MG PO TABS
1000.0000 mg | ORAL_TABLET | Freq: Four times a day (QID) | ORAL | Status: DC
  Administered 2017-06-19 – 2017-06-21 (×7): 1000 mg via ORAL
  Filled 2017-06-19 (×7): qty 2

## 2017-06-19 MED ORDER — KETOROLAC TROMETHAMINE 30 MG/ML IJ SOLN
30.0000 mg | Freq: Four times a day (QID) | INTRAMUSCULAR | Status: AC
  Administered 2017-06-19 – 2017-06-20 (×4): 30 mg via INTRAVENOUS
  Filled 2017-06-19 (×4): qty 1

## 2017-06-19 MED ORDER — ONDANSETRON HCL 4 MG/2ML IJ SOLN
4.0000 mg | Freq: Four times a day (QID) | INTRAMUSCULAR | Status: DC | PRN
  Administered 2017-06-21: 4 mg via INTRAVENOUS
  Filled 2017-06-19: qty 2

## 2017-06-19 MED ORDER — DEXMEDETOMIDINE HCL IN NACL 200 MCG/50ML IV SOLN
INTRAVENOUS | Status: AC
  Filled 2017-06-19: qty 50

## 2017-06-19 MED ORDER — SUFENTANIL CITRATE 50 MCG/ML IV SOLN
INTRAVENOUS | Status: AC
  Filled 2017-06-19: qty 1

## 2017-06-19 MED ORDER — HYDROMORPHONE HCL 1 MG/ML IJ SOLN
INTRAMUSCULAR | Status: AC
  Filled 2017-06-19: qty 1

## 2017-06-19 MED ORDER — ENSURE ENLIVE PO LIQD: 237.0000 mL | Freq: Two times a day (BID) | ORAL | Status: DC

## 2017-06-19 MED ORDER — ONDANSETRON HCL 4 MG/2ML IJ SOLN
INTRAMUSCULAR | Status: DC | PRN
  Administered 2017-06-19: 4 mg via INTRAVENOUS

## 2017-06-19 MED ORDER — LIDOCAINE 2% (20 MG/ML) 5 ML SYRINGE
INTRAMUSCULAR | Status: AC
  Filled 2017-06-19: qty 5

## 2017-06-19 MED ORDER — OXYCODONE HCL 5 MG PO TABS: 5.0000 mg | ORAL_TABLET | Freq: Once | ORAL | Status: DC | PRN

## 2017-06-19 MED ORDER — ACETAMINOPHEN 500 MG PO TABS
1000.0000 mg | ORAL_TABLET | ORAL | Status: AC
  Administered 2017-06-19: 1000 mg via ORAL
  Filled 2017-06-19: qty 2

## 2017-06-19 MED ORDER — SCOPOLAMINE 1 MG/3DAYS TD PT72
1.0000 | MEDICATED_PATCH | TRANSDERMAL | Status: DC
  Administered 2017-06-19: 1.5 mg via TRANSDERMAL
  Filled 2017-06-19: qty 1

## 2017-06-19 MED ORDER — OXYCODONE HCL 5 MG/5ML PO SOLN
5.0000 mg | Freq: Once | ORAL | Status: DC | PRN
  Filled 2017-06-19: qty 5

## 2017-06-19 MED ORDER — STERILE WATER FOR IRRIGATION IR SOLN
Status: DC | PRN
  Administered 2017-06-19: 1000 mL

## 2017-06-19 MED ORDER — MIDAZOLAM HCL 2 MG/2ML IJ SOLN
1.0000 mg | INTRAMUSCULAR | Status: DC
  Administered 2017-06-19: 2 mg via INTRAVENOUS
  Filled 2017-06-19: qty 2

## 2017-06-19 MED ORDER — KCL IN DEXTROSE-NACL 20-5-0.45 MEQ/L-%-% IV SOLN
INTRAVENOUS | Status: DC
  Administered 2017-06-19: 19:00:00 via INTRAVENOUS
  Filled 2017-06-19 (×2): qty 1000

## 2017-06-19 MED ORDER — ONDANSETRON HCL 4 MG/2ML IJ SOLN
INTRAMUSCULAR | Status: AC
  Filled 2017-06-19: qty 4

## 2017-06-19 SURGICAL SUPPLY — 64 items
ATTRACTOMAT 16X20 MAGNETIC DRP (DRAPES) ×3 IMPLANT
BANDAGE ADH SHEER 1  50/CT (GAUZE/BANDAGES/DRESSINGS) ×3 IMPLANT
BENZOIN TINCTURE PRP APPL 2/3 (GAUZE/BANDAGES/DRESSINGS) ×3 IMPLANT
CABLE HIGH FREQUENCY MONO STRZ (ELECTRODE) IMPLANT
CELLS DAT CNTRL 66122 CELL SVR (MISCELLANEOUS) ×1 IMPLANT
CHLORAPREP W/TINT 26ML (MISCELLANEOUS) ×3 IMPLANT
CONT SPEC 4OZ CLIKSEAL STRL BL (MISCELLANEOUS) IMPLANT
COVER SURGICAL LIGHT HANDLE (MISCELLANEOUS) ×3 IMPLANT
DERMABOND ADVANCED (GAUZE/BANDAGES/DRESSINGS) ×2
DERMABOND ADVANCED .7 DNX12 (GAUZE/BANDAGES/DRESSINGS) ×1 IMPLANT
DRAPE INCISE IOBAN 66X45 STRL (DRAPES) ×3 IMPLANT
DRAPE UTILITY XL STRL (DRAPES) IMPLANT
DRSG OPSITE POSTOP 4X12 (GAUZE/BANDAGES/DRESSINGS) ×3 IMPLANT
ELECT PENCIL ROCKER SW 15FT (MISCELLANEOUS) ×3 IMPLANT
ELECT REM PT RETURN 15FT ADLT (MISCELLANEOUS) ×3 IMPLANT
GLOVE BIO SURGEON STRL SZ 6 (GLOVE) ×6 IMPLANT
GLOVE BIO SURGEON STRL SZ 6.5 (GLOVE) ×2 IMPLANT
GLOVE BIO SURGEONS STRL SZ 6.5 (GLOVE) ×1
GOWN STRL NON-REIN LRG LVL3 (GOWN DISPOSABLE) ×6 IMPLANT
GOWN STRL REUS W/ TWL LRG LVL3 (GOWN DISPOSABLE) ×2 IMPLANT
GOWN STRL REUS W/TWL LRG LVL3 (GOWN DISPOSABLE) ×4
HANDLE SUCTION POOLE (INSTRUMENTS) ×1 IMPLANT
HEMOSTAT SURGICEL 4X8 (HEMOSTASIS) ×3 IMPLANT
HOLDER FOLEY CATH W/STRAP (MISCELLANEOUS) ×3 IMPLANT
IRRIG SUCT STRYKERFLOW 2 WTIP (MISCELLANEOUS)
IRRIGATION SUCT STRKRFLW 2 WTP (MISCELLANEOUS) IMPLANT
KIT BASIN OR (CUSTOM PROCEDURE TRAY) ×3 IMPLANT
MANIPULATOR UTERINE 4.5 ZUMI (MISCELLANEOUS) IMPLANT
POUCH SPECIMEN RETRIEVAL 10MM (ENDOMECHANICALS) IMPLANT
RTRCTR WOUND ALEXIS 18CM MED (MISCELLANEOUS) ×3
SCISSORS LAP 5X35 DISP (ENDOMECHANICALS) IMPLANT
SEPRAFILM MEMBRANE 5X6 (MISCELLANEOUS) ×3 IMPLANT
SHEET LAVH (DRAPES) ×3 IMPLANT
SPONGE LAP 18X18 RF (DISPOSABLE) IMPLANT
SUCTION POOLE HANDLE (INSTRUMENTS) ×3
SUT MNCRL AB 4-0 PS2 18 (SUTURE) ×9 IMPLANT
SUT MON AB 3-0 SH 27 (SUTURE) ×2
SUT MON AB 3-0 SH27 (SUTURE) ×1 IMPLANT
SUT PDS AB 1 TP1 96 (SUTURE) ×6 IMPLANT
SUT PLAIN 2 0 XLH (SUTURE) ×6 IMPLANT
SUT VIC AB 0 CT1 27 (SUTURE) ×8
SUT VIC AB 0 CT1 27XBRD ANTBC (SUTURE) ×4 IMPLANT
SUT VIC AB 2-0 CT2 27 (SUTURE) IMPLANT
SUT VIC AB 2-0 SH 27 (SUTURE)
SUT VIC AB 2-0 SH 27X BRD (SUTURE) IMPLANT
SUT VIC AB 3-0 PS2 18 (SUTURE)
SUT VIC AB 3-0 PS2 18XBRD (SUTURE) IMPLANT
SUT VIC AB 3-0 SH 27 (SUTURE)
SUT VIC AB 3-0 SH 27X BRD (SUTURE) IMPLANT
SUT VIC AB 4-0 PS2 27 (SUTURE) ×12 IMPLANT
SUT VICRYL 0 TIES 12 18 (SUTURE) ×3 IMPLANT
TAPE STRIPS DRAPE STRL (GAUZE/BANDAGES/DRESSINGS) ×3 IMPLANT
TOWEL OR 17X26 10 PK STRL BLUE (TOWEL DISPOSABLE) ×3 IMPLANT
TOWEL OR NON WOVEN STRL DISP B (DISPOSABLE) ×3 IMPLANT
TRAP SPECIMEN MUCOUS 40CC (MISCELLANEOUS) ×3 IMPLANT
TRAY FOLEY MTR SLVR 14FR STAT (SET/KITS/TRAYS/PACK) IMPLANT
TRAY FOLEY MTR SLVR 16FR STAT (SET/KITS/TRAYS/PACK) ×3 IMPLANT
TRAY LAPAROSCOPIC (CUSTOM PROCEDURE TRAY) ×3 IMPLANT
TROCAR XCEL 12X100 BLDLESS (ENDOMECHANICALS) ×3 IMPLANT
TROCAR XCEL BLUNT TIP 100MML (ENDOMECHANICALS) IMPLANT
TUBING INSUF HEATED (TUBING) ×3 IMPLANT
TUBING NON-CON 1/4 X 20 CONN (TUBING) IMPLANT
TUBING NON-CON 1/4 X 20' CONN (TUBING)
YANKAUER SUCT BULB TIP NO VENT (SUCTIONS) ×3 IMPLANT

## 2017-06-19 NOTE — Anesthesia Preprocedure Evaluation (Signed)
Anesthesia Evaluation  Patient identified by MRN, date of birth, ID band Patient awake    Reviewed: Allergy & Precautions, H&P , NPO status , Patient's Chart, lab work & pertinent test results  History of Anesthesia Complications (+) PONV and history of anesthetic complications  Airway Mallampati: II   Neck ROM: full    Dental   Pulmonary Current Smoker,    breath sounds clear to auscultation       Cardiovascular negative cardio ROS   Rhythm:regular Rate:Normal     Neuro/Psych  Headaches,    GI/Hepatic   Endo/Other  obese  Renal/GU      Musculoskeletal   Abdominal   Peds  Hematology   Anesthesia Other Findings   Reproductive/Obstetrics PCOS                             Anesthesia Physical Anesthesia Plan  ASA: II  Anesthesia Plan: General   Post-op Pain Management:  Regional for Post-op pain   Induction: Intravenous  PONV Risk Score and Plan: 3 and Treatment may vary due to age or medical condition, Ondansetron, Dexamethasone, Midazolam and Scopolamine patch - Pre-op  Airway Management Planned: Oral ETT  Additional Equipment:   Intra-op Plan:   Post-operative Plan: Extubation in OR  Informed Consent: I have reviewed the patients History and Physical, chart, labs and discussed the procedure including the risks, benefits and alternatives for the proposed anesthesia with the patient or authorized representative who has indicated his/her understanding and acceptance.     Plan Discussed with: CRNA, Anesthesiologist and Surgeon  Anesthesia Plan Comments:         Anesthesia Quick Evaluation

## 2017-06-19 NOTE — Anesthesia Procedure Notes (Signed)
Anesthesia Regional Block: TAP block   Pre-Anesthetic Checklist: ,, timeout performed, Correct Patient, Correct Site, Correct Laterality, Correct Procedure, Correct Position, site marked, Risks and benefits discussed,  Surgical consent,  Pre-op evaluation,  At surgeon's request and post-op pain management  Laterality: Right  Prep: chloraprep       Needles:  Injection technique: Single-shot  Needle Type: Echogenic Needle     Needle Length: 9cm  Needle Gauge: 21     Additional Needles:   Narrative:  Start time: 06/19/2017 1:06 PM End time: 06/19/2017 1:11 PM Injection made incrementally with aspirations every 5 mL.  Performed by: Personally  Anesthesiologist: Albertha Ghee, MD  Additional Notes: Pt tolerated the procedure well.

## 2017-06-19 NOTE — Interval H&P Note (Signed)
History and Physical Interval Note:  06/19/2017 12:51 PM  Valerie Shaw  has presented today for surgery, with the diagnosis of OVARIAN MASS  The various methods of treatment have been discussed with the patient and family. After consideration of risks, benefits and other options for treatment, the patient has consented to  Procedure(s): EXPLORATORY LAPAROSCOPIC UNILATERAL SALPINGO OOPHORECTOMY (N/A) POSSIBLE HYSTERECTOMY ABDOMINAL WITH BILATERAL SALPINGO-OOPHORECTOMY (N/A) POSSIBL STAGING (N/A) as a surgical intervention .  The patient's history has been reviewed, patient examined, no change in status, stable for surgery.  I have reviewed the patient's chart and labs.  Questions were answered to the patient's satisfaction.     Isabel Caprice

## 2017-06-19 NOTE — Anesthesia Procedure Notes (Signed)
Procedure Name: Intubation Date/Time: 06/19/2017 1:37 PM Performed by: Sharlette Dense, CRNA Patient Re-evaluated:Patient Re-evaluated prior to induction Oxygen Delivery Method: Circle system utilized Preoxygenation: Pre-oxygenation with 100% oxygen Induction Type: IV induction Ventilation: Mask ventilation without difficulty and Oral airway inserted - appropriate to patient size Laryngoscope Size: Sabra Heck and 2 Grade View: Grade I Tube type: Oral Tube size: 7.5 mm Number of attempts: 1 Airway Equipment and Method: Stylet Placement Confirmation: ETT inserted through vocal cords under direct vision,  positive ETCO2 and breath sounds checked- equal and bilateral Secured at: 21 cm Tube secured with: Tape Dental Injury: Teeth and Oropharynx as per pre-operative assessment

## 2017-06-19 NOTE — Transfer of Care (Signed)
Immediate Anesthesia Transfer of Care Note  Patient: Valerie Shaw  Procedure(s) Performed: EXPLORATORY LAPAROTOMY RIGHT SALPINGO OOPHORECTOMY (N/A )  Patient Location: PACU  Anesthesia Type:GA combined with regional for post-op pain  Level of Consciousness: awake, alert  and oriented  Airway & Oxygen Therapy: Patient Spontanous Breathing and Patient connected to face mask oxygen  Post-op Assessment: Report given to RN and Post -op Vital signs reviewed and stable  Post vital signs: Reviewed and stable  Last Vitals:  Vitals Value Taken Time  BP 138/87 06/19/2017  4:03 PM  Temp    Pulse 81 06/19/2017  4:04 PM  Resp 16 06/19/2017  4:04 PM  SpO2 100 % 06/19/2017  4:04 PM  Vitals shown include unvalidated device data.  Last Pain:  Vitals:   06/19/17 1236  TempSrc:   PainSc: 0-No pain         Complications: No apparent anesthesia complications

## 2017-06-19 NOTE — Anesthesia Procedure Notes (Signed)
Anesthesia Regional Block: TAP block   Pre-Anesthetic Checklist: ,, timeout performed, Correct Patient, Correct Site, Correct Laterality, Correct Procedure, Correct Position, site marked, Risks and benefits discussed,  Surgical consent,  Pre-op evaluation,  At surgeon's request and post-op pain management  Laterality: Left  Prep: chloraprep       Needles:  Injection technique: Single-shot  Needle Type: Echogenic Needle     Needle Length: 9cm  Needle Gauge: 21     Additional Needles:   Narrative:  Start time: 06/19/2017 12:56 PM End time: 06/19/2017 1:05 PM Injection made incrementally with aspirations every 5 mL.  Performed by: Personally  Anesthesiologist: Albertha Ghee, MD  Additional Notes: Pt tolerated the procedure well.

## 2017-06-19 NOTE — Op Note (Addendum)
Date: 06/19/17   Preoperative Diagnosis:  1.  Pelvic mass   Postoperative Diagnosis:  1. 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  2. Hemorrhagic left ovarian cyst   Procedure(s) Performed:  1. Laparotomy with right salpingo-oophorectomy 2. Pelvic washings  3. Incidental drainage left hemorrhagic ovarian cyst   Surgeon: Mart Piggs, MD   Assistant Surgeon: Lahoma Crocker, M.D. (an MD assistant was necessary for tissue manipulation, management of instrumentation, retraction and positioning due to the complexity of the case and hospital policies).   Specimens: Right tube/ovary, and washings   Estimated Blood Loss: 50 mL.     Complications: None.    Operative Findings:  20+cm right ovarian mass, 3cm left ovarian cyst. 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  No evidence of intra-abdominal pelvic or peritoneal metastases.     Anesthesia: TAP block and GETA   Procedure in Detail:    The patient was taken the operating room.  General endotracheal anesthesia was performed without complications.  The patient was placed in dorsal lithotomy position.  She was prepped and draped in sterile fashion.  A timeout was performed.     A Foley catheter was placed to gravity by me.   A midline vertical incision was made and carried through the subcutaneous tissue to the fascia. The fascial incision was made and extended superiorally. The rectus muscles were separated. The peritoneum was identified and entered. Peritoneal incision was extended longitudinally.  The abdominal cavity was entered sharply and without incident. Exploration revealed the above findings.  The ovarian mass was delivered through the abdominal incision and noted to be emanating from the right side.    After packing the bowel into the upper abdomen.  The ureter was noted traversing along the pelvic brim and it was identified transperitoneally.   The broad ligament was attenuated to the level of the mass from the pelvic sidewall and I was able to identify a window in the superior broad ligament well away from the visualized ureter. I clamped across the right IP ligament using the window created in the broad ligament and then clamped across the utero-ovarian ligament. Back-clamps were placed on both pedicles and the specimen transected.  The remaining pedicles were then ligated with 0 Vicryl.  Hemostasis was noted.  The specimen was sent for frozen section as right tube and ovary.    Exploration of the pelvis and left tube and ovary revealed a hemorrhagic cyst and an incidental site where there was drainage.  The walls of the opening were rendered hemostatic by placing a running 3-0 Monocryl suture.  Irrigation was used and hemostasis was noted.  The upper abdomen and omentum were examined ; no evidence of disease.   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   Seprafilm was wrapped around the left ovary.   The fascia was reapproximated with 0 looped PDS using a total of two sutures. The subcutaneous layer was then irrigated copiously and reapproximated with 2-0 plain gut suture.  The subcutaneous layer was then reapproximated with interrupted 4-0 Vicryl and then running 4-0 Monocryl.     The patient tolerated the procedure well. Sponge, lap and needle counts were correct x 2.

## 2017-06-19 NOTE — Progress Notes (Signed)
AssistedDr. Marcie Bal with right, left, ultrasound guided, transabdominal plane block. Side rails up, monitors on throughout procedure. See vital signs in flow sheet. Tolerated Procedure well.

## 2017-06-20 ENCOUNTER — Encounter (HOSPITAL_COMMUNITY): Payer: Self-pay | Admitting: Obstetrics

## 2017-06-20 ENCOUNTER — Other Ambulatory Visit: Payer: Self-pay

## 2017-06-20 LAB — CBC
HCT: 37.7 % (ref 36.0–46.0)
HEMOGLOBIN: 12.7 g/dL (ref 12.0–15.0)
MCH: 29.8 pg (ref 26.0–34.0)
MCHC: 33.7 g/dL (ref 30.0–36.0)
MCV: 88.5 fL (ref 78.0–100.0)
Platelets: 239 10*3/uL (ref 150–400)
RBC: 4.26 MIL/uL (ref 3.87–5.11)
RDW: 12.7 % (ref 11.5–15.5)
WBC: 12.9 10*3/uL — ABNORMAL HIGH (ref 4.0–10.5)

## 2017-06-20 LAB — BASIC METABOLIC PANEL
Anion gap: 9 (ref 5–15)
BUN: 12 mg/dL (ref 6–20)
CHLORIDE: 104 mmol/L (ref 101–111)
CO2: 23 mmol/L (ref 22–32)
Calcium: 8.9 mg/dL (ref 8.9–10.3)
Creatinine, Ser: 0.81 mg/dL (ref 0.44–1.00)
GFR calc Af Amer: >60 mL/min (ref >60)
GFR calc non Af Amer: >60 mL/min (ref >60)
GLUCOSE: 123 mg/dL — AB (ref 65–99)
POTASSIUM: 4.6 mmol/L (ref 3.5–5.1)
Sodium: 136 mmol/L (ref 135–145)

## 2017-06-20 NOTE — Progress Notes (Signed)
1 Day Post-Op Procedure(s) (LRB): EXPLORATORY LAPAROTOMY RIGHT SALPINGO OOPHORECTOMY (N/A)  Subjective: Patient reports moderate abdominal incisional pain.  Having to use IV pain medication as well.  Ambulated in the hall but reported significant abdominal pain.  Reports slight dizziness when not resting head back on the chair or in bed. Severe pain reported with coughing.  Tolerated an omelet with no nausea or emesis reported.  Husband at the bedside. No flatus or BM reported but belching.     Objective: Vital signs in last 24 hours: Temp:  [97.5 F (36.4 C)-98.7 F (37.1 C)] 98.2 F (36.8 C) (05/10 0954) Pulse Rate:  [50-85] 52 (05/10 0954) Resp:  [11-23] 16 (05/10 0954) BP: (106-147)/(34-97) 109/70 (05/10 0954) SpO2:  [95 %-100 %] 100 % (05/10 0954) Weight:  [233 lb (105.7 kg)] 233 lb (105.7 kg) (05/09 1236) Last BM Date: 06/19/17  Intake/Output from previous day: 05/09 0701 - 05/10 0700 In: 2833.3 [P.O.:480; I.V.:2198.3; IV Piggyback:155] Out: 1850 [Urine:1800; Blood:50]  Physical Examination: General: alert, cooperative and grimacing intermittently with movement/coughing Resp: clear to auscultation bilaterally Cardio: regular rate and rhythm, S1, S2 normal, no murmur, click, rub or gallop GI: incision: midline incision with op site dressing in place with no active bleeding or drainage noted and abdomen obese, soft, non-tympanic, active bowel sounds Extremities: extremities normal, atraumatic, no cyanosis or edema  Labs: WBC/Hgb/Hct/Plts:  12.9/12.7/37.7/239 (05/10 9024) BUN/Cr/glu/ALT/AST/amyl/lip:  12/0.81/--/--/--/--/-- (05/10 0973)  Assessment: 32 y.o. s/p Procedure(s): EXPLORATORY LAPAROTOMY RIGHT SALPINGO OOPHORECTOMY: stable Pain:  Pain is slightly well-controlled on PRN medications.  Heme: Hgb 12.7 and Hct 37.7 this am.  Appropriate with EBL.  CV: BP and HR stable post-operatively.  Continue to monitor with ordered vital signs.  GI:  Tolerating po: Yes.   Antiemetics ordered PRN.  GU: Due to void since foley removal.  Adequate output reported.    FEN: Bmet this am.  Prophylaxis: SCDs.  Plan: Kpad Abdominal binder  IS to the room Encourage use of PO pain medication Encourage IS use, deep breathing, and coughing Encourage ambulation Continue plan of care per Dr. Denman George Plan for discharge when dizziness improves, pain better controlled, tolerating diet, voiding, ambulating   LOS: 1 day    Rose Hippler D Kaiyu Mirabal 06/20/2017, 10:32 AM

## 2017-06-20 NOTE — Discharge Summary (Signed)
Physician Discharge Summary  Patient ID: Valerie Shaw MRN: [id number] DOB/AGE: 04/06/1985 32 y.o.  Admit date: 06/19/2017 Discharge date: 06/21/2017  Admission Diagnoses: Pelvic mass in female  Discharge Diagnoses:  Principal Problem:   Pelvic mass in female   Discharged Condition: good  Hospital Course:  1/ patient was admitted on 06/19/17 for an ex lap, right salpingo  Oophorectomy for a benign mucinous ovarian neoplasm 2/ surgery was uncomplicated  3/ on postoperative day 2 the patient was meeting discharge criteria: tolerating PO, voiding urine, ambulating, pain well controlled on oral medications.  4/ new medications on discharge include oxycodone, senakot.   Consults: None  Significant Diagnostic Studies: labs:  CBC    Component Value Date/Time   WBC 12.9 (H) 06/20/2017 0442   RBC 4.26 06/20/2017 0442   HGB 12.7 06/20/2017 0442   HCT 37.7 06/20/2017 0442   PLT 239 06/20/2017 0442   MCV 88.5 06/20/2017 0442   MCH 29.8 06/20/2017 0442   MCHC 33.7 06/20/2017 0442   RDW 12.7 06/20/2017 0442     Treatments: surgery: see above  Discharge Exam: Blood pressure 99/63, pulse (!) 46, temperature 98.4 F (36.9 C), temperature source Oral, resp. rate 16, height 5\' 5"  (1.651 m), weight 233 lb (105.7 kg), last menstrual period 05/28/2017, SpO2 99 %. General appearance: alert and cooperative GI: soft, non-tender; bowel sounds normal; no masses,  no organomegaly Incision/Wound: clean and in tact with dressing on   Disposition: Discharge disposition: 01-Home or Self Care       Discharge Instructions    (HEART FAILURE PATIENTS) Call MD:  Anytime you have any of the following symptoms: 1) 3 pound weight gain in 24 hours or 5 pounds in 1 week 2) shortness of breath, with or without a dry hacking cough 3) swelling in the hands, feet or stomach 4) if you have to sleep on extra pillows at night in order to breathe.   Complete by:  As directed    Call MD for:  difficulty  breathing, headache or visual disturbances   Complete by:  As directed    Call MD for:  extreme fatigue   Complete by:  As directed    Call MD for:  hives   Complete by:  As directed    Call MD for:  persistant dizziness or light-headedness   Complete by:  As directed    Call MD for:  persistant nausea and vomiting   Complete by:  As directed    Call MD for:  redness, tenderness, or signs of infection (pain, swelling, redness, odor or green/yellow discharge around incision site)   Complete by:  As directed    Call MD for:  severe uncontrolled pain   Complete by:  As directed    Call MD for:  temperature >100.4   Complete by:  As directed    Diet - low sodium heart healthy   Complete by:  As directed    Diet general   Complete by:  As directed    Driving Restrictions   Complete by:  As directed    No driving for 7 days or until off narcotic pain medication   Increase activity slowly   Complete by:  As directed    Remove dressing in 24 hours   Complete by:  As directed    Sexual Activity Restrictions   Complete by:  As directed    No intercourse for 6 weeks     Allergies as of 06/21/2017  Reactions   Penicillins Other (See Comments)   REACTION: Mother is allergic so she has never been given any. [place]  October 16, 2007 10:01 AM Has patient had a PCN reaction causing immediate rash, facial/tongue/throat swelling, SOB or lightheadedness with hypotension: No Has patient had a PCN reaction causing severe rash involving mucus membranes or skin necrosis: No Has patient had a PCN reaction that required hospitalization: No Has patient had a PCN reaction occurring within the last 10 years: No If all of the above answe      Medication List    TAKE these medications   acetaminophen 500 MG tablet Commonly known as:  TYLENOL Take 2 tablets (1,000 mg total) by mouth every 6 (six) hours.   benzocaine-resorcinol 5-2 % vaginal cream Commonly known as:   VAGISIL Place 1 application vaginally 2 (two) times daily as needed for itching or irritation.   ibuprofen 200 MG tablet Commonly known as:  ADVIL,MOTRIN Take 800 mg by mouth every 8 (eight) hours as needed (for pain.).   oxyCODONE 5 MG immediate release tablet Commonly known as:  Oxy IR/ROXICODONE Take 1 tablet (5 mg total) by mouth every 4 (four) hours as needed for severe pain or breakthrough pain.   senna 8.6 MG Tabs tablet Commonly known as:  SENOKOT Take 1 tablet (8.6 mg total) by mouth at bedtime.      Follow-up Information    Isabel Caprice, MD Follow up in 3 week(s).   Specialty:  Gynecologic Oncology Contact information: [place] [address] [phone]           Signed: Thereasa Solo 06/21/2017, 8:33 AM

## 2017-06-21 MED ORDER — SENNA 8.6 MG PO TABS: 1.0000 | ORAL_TABLET | Freq: Every day | ORAL | 0 refills | Status: DC

## 2017-06-21 MED ORDER — OXYCODONE HCL 5 MG PO TABS: 5.0000 mg | ORAL_TABLET | ORAL | 0 refills | Status: DC | PRN

## 2017-06-21 MED ORDER — ACETAMINOPHEN 500 MG PO TABS: 1000.0000 mg | ORAL_TABLET | Freq: Four times a day (QID) | ORAL | 0 refills | Status: DC

## 2017-06-21 MED ORDER — BISACODYL 10 MG RE SUPP
10.0000 mg | Freq: Once | RECTAL | Status: AC
  Administered 2017-06-21: 10 mg via RECTAL
  Filled 2017-06-21: qty 1

## 2017-06-21 NOTE — Discharge Instructions (Signed)
06/20/2017  Return to work: 6 weeks  Activity: 1. Be up and out of the bed during the day.  Take a nap if needed.  You may walk up steps but be careful and use the hand rail.  Stair climbing will tire you more than you think, you may need to stop part way and rest.   2. No lifting or straining for 6 weeks.  3. No driving for 2 weeks.  Do Not drive if you are taking narcotic pain medicine.  4. Shower daily.  Use soap and water on your incision and pat dry; don't rub.   5. No sexual activity and nothing in the vagina for 2 weeks.  Medications:  - Take ibuprofen and tylenol first line for pain control. Take these regularly (every 6 hours) to decrease the build up of pain.  - If necessary, for severe pain not relieved by ibuprofen, take oxycodone.  - While taking oxycodone you should take sennakot every night to reduce the likelihood of constipation. If this causes diarrhea, stop its use.  Diet: 1. Low sodium Heart Healthy Diet is recommended.  2. It is safe to use a laxative if you have difficulty moving your bowels.   Wound Care: 1. Keep clean and dry.  Shower daily. 2. Dressing can come off by Tuesday after surgery.  Reasons to call the Doctor:   Fever - Oral temperature greater than 100.4 degrees Fahrenheit  Foul-smelling vaginal discharge  Difficulty urinating  Nausea and vomiting  Increased pain at the site of the incision that is unrelieved with pain medicine.  Difficulty breathing with or without chest pain  New calf pain especially if only on one side  Sudden, continuing increased vaginal bleeding with or without clots.   Follow-up: 1. See Dr Gerarda Fraction in approximately 3 weeks.  Contacts: For questions or concerns you should contact:  Dr. Gerarda Fraction at [phone] After hours and on week-ends call [phone] and ask to speak to the physician on call for Gynecologic Oncology

## 2017-06-21 NOTE — Progress Notes (Signed)
Assessment unchanged. Dulcolax suppository effective. Pt and husband verbalized understanding of dc instructions through teach back including follow care and when to call the doctor. No script at dc. Discharged via wc to front entrance accomapnied by husband and NT.

## 2017-06-21 NOTE — Progress Notes (Signed)
Pt awake with c/o of feeling full of gas and bloated despite having made several laps in the hall yesterday. Pt given chewing gum at this time as well as small container of prune juice since patient is declining the need/desire to take a suppository or an enema at this time. Pt encouraged to bring this matter to MD/surgeon's attention during rounds this morning.

## 2017-06-23 ENCOUNTER — Telehealth: Payer: Self-pay | Admitting: Gynecologic Oncology

## 2017-06-23 NOTE — Anesthesia Postprocedure Evaluation (Signed)
Anesthesia Post Note  Patient: Valerie Shaw  Procedure(s) Performed: EXPLORATORY LAPAROTOMY RIGHT SALPINGO OOPHORECTOMY (N/A )     Patient location during evaluation: PACU Anesthesia Type: General Level of consciousness: awake and alert Pain management: pain level controlled Vital Signs Assessment: post-procedure vital signs reviewed and stable Respiratory status: spontaneous breathing, nonlabored ventilation, respiratory function stable and patient connected to nasal cannula oxygen Cardiovascular status: blood pressure returned to baseline and stable Postop Assessment: no apparent nausea or vomiting Anesthetic complications: no    Last Vitals:  Vitals:   06/20/17 2058 06/21/17 0620  BP: 109/62 99/63  Pulse: (!) 54 (!) 46  Resp: 18 16  Temp: 36.8 C 36.9 C  SpO2: 100% 99%    Last Pain:  Vitals:   06/21/17 1028  TempSrc:   PainSc: 2    Pain Goal: Patients Stated Pain Goal: 2 (06/21/17 0800)               Aldahir Litaker S

## 2017-06-23 NOTE — Telephone Encounter (Signed)
Patient called this am with several questions.  Asking about moderate pain on her left abdomen.  She has been trying not to take the pain medication due to constipation.  She has not had a BM since she has been home.  She is taking Miralax once daily.  Discussed bowel regimen.  No fever or chills reported.  Advised she could take ibuprofen and tylenol alternating for pain as well.  She is to call with an update on her symptoms.  Follow up is for May 20 but advised we can always see her sooner if she feels she is not progressing. Reportable signs and symptoms reviewed. No drainage from incision.  She is going to remove the op site dressing in the am.

## 2017-06-24 ENCOUNTER — Other Ambulatory Visit: Payer: Self-pay | Admitting: Gynecologic Oncology

## 2017-06-24 DIAGNOSIS — N838 Other noninflammatory disorders of ovary, fallopian tube and broad ligament: Secondary | ICD-10-CM

## 2017-06-24 DIAGNOSIS — G8918 Other acute postprocedural pain: Secondary | ICD-10-CM

## 2017-06-24 MED ORDER — ACETAMINOPHEN 500 MG PO TABS: 1000.0000 mg | ORAL_TABLET | Freq: Four times a day (QID) | ORAL | 0 refills | Status: DC

## 2017-06-24 MED ORDER — IBUPROFEN 600 MG PO TABS: 600.0000 mg | ORAL_TABLET | Freq: Four times a day (QID) | ORAL | 0 refills | Status: DC | PRN

## 2017-06-24 NOTE — Progress Notes (Signed)
Patient called the office with several questions.  She was stating she had a small amount of dried blood in her umbilicus when she removed the dressing.  Reassured this can be a normal finding and that she could lightly use a washcloth to remove.  Stating she had a BM this am.  Continuing to report left abdominal pain.  She states she has only take 3 oxycodone since she has been home.  She has been taking Tylenol every six hours and states she has not taken any ibuprofen because she was scared to take it since she was advised not to take before surgery.  Advised to begin alternating tylenol and ibuprofen and to continue to monitor tylenol intake with max of 4000 mg in 24 hour period.  She is going to start taking ibuprofen and call the office if her pain is moderate.  Advised she can be seen in the office today or tomorrow or move her follow up with Dr. Gerarda Fraction to a sooner date if needed.  At rest, her abdominal pain is controlled and she states it worsens with movement and coughing which would be expected.  Reportable signs and symptoms reviewed.  No fever or chills reported.

## 2017-06-25 ENCOUNTER — Telehealth: Payer: Self-pay | Admitting: *Deleted

## 2017-06-25 NOTE — Telephone Encounter (Signed)
Called and spoke with the patient. The patient moved the appt from 5/20 to 5/17.

## 2017-06-27 ENCOUNTER — Encounter: Payer: Self-pay | Admitting: Obstetrics

## 2017-06-27 ENCOUNTER — Inpatient Hospital Stay (HOSPITAL_BASED_OUTPATIENT_CLINIC_OR_DEPARTMENT_OTHER): Payer: 59 | Admitting: Obstetrics

## 2017-06-27 ENCOUNTER — Inpatient Hospital Stay: Payer: 59

## 2017-06-27 VITALS — BP 124/75 | HR 68 | Temp 98.2°F | Resp 20 | Ht 65.0 in | Wt 220.4 lb

## 2017-06-27 DIAGNOSIS — F17210 Nicotine dependence, cigarettes, uncomplicated: Secondary | ICD-10-CM | POA: Diagnosis not present

## 2017-06-27 DIAGNOSIS — R102 Pelvic and perineal pain: Secondary | ICD-10-CM | POA: Diagnosis not present

## 2017-06-27 DIAGNOSIS — R1900 Intra-abdominal and pelvic swelling, mass and lump, unspecified site: Secondary | ICD-10-CM | POA: Diagnosis present

## 2017-06-27 DIAGNOSIS — E282 Polycystic ovarian syndrome: Secondary | ICD-10-CM | POA: Diagnosis not present

## 2017-06-27 LAB — HEMOGLOBIN AND HEMATOCRIT (CANCER CENTER ONLY)
HCT: 39.9 % (ref 34.8–46.6)
Hemoglobin: 13.3 g/dL (ref 11.6–15.9)

## 2017-06-27 NOTE — Patient Instructions (Signed)
Labwork to be drawn today. Please go to the lab after this visit.   Please follow-up in one month.  Please call our office if you have any questions or concerns at [phone].

## 2017-06-30 ENCOUNTER — Ambulatory Visit: Payer: 59 | Admitting: Obstetrics

## 2017-07-04 ENCOUNTER — Encounter: Payer: Self-pay | Admitting: Obstetrics

## 2017-07-04 ENCOUNTER — Telehealth: Payer: Self-pay | Admitting: *Deleted

## 2017-07-04 ENCOUNTER — Other Ambulatory Visit: Payer: Self-pay | Admitting: Obstetrics

## 2017-07-04 MED ORDER — CEPHALEXIN 500 MG PO CAPS: 500.0000 mg | ORAL_CAPSULE | Freq: Four times a day (QID) | ORAL | 0 refills | Status: DC

## 2017-07-04 NOTE — Telephone Encounter (Signed)
Patient called and states " I am having issues with my incision site, one of the sutures was flapping around yesterday so I pulled it out, now I notice pus around the site, and I also see a string hanging out by the incision site". Informed the patient that I will consult Dr. Gerarda Fraction. Patient verbalized understanding.

## 2017-07-04 NOTE — Telephone Encounter (Signed)
Told Valerie Shaw that Dr. Gerarda Fraction sent in a prescription for Keflex.  This is fine to use as she had Ancef in the OR for her surgery per Dr. Gerarda Fraction Requested pt call the office on Tuesday with an up date on how the incision looks. If she develops a fever of 101.5 or greater, shaking chills or incision looks more red and purulent to call the after hours number or go to a local ED. Pt verbalized understanding.

## 2017-07-07 NOTE — Progress Notes (Signed)
Consult Note: Gyn-Onc  Consult was requested by Dr. Beatrice Lecher  CC:  Chief Complaint  Patient presents with  . Pelvic mass    HPI: Ms. Valerie Shaw  is a very nice 32 y.o. P1  She has a long-standing history of infertility and PCOS.  3 days prior to presentation here she noticed some tightening in her abdomen and discomfort.  After 48 hours this did not improve and she scheduled a visit with her primary care provider after taking the day off of work yesterday.  Her primary care provider ordered an ultrasound of the abdomen and pelvis and the patient was performed 06/16/2017 revealing a 24 cm complex cyst presumed adnexal origin.  The ultrasound describes thick septations some of which appear vascular concerns for malignancy.  In addition there was a complex cyst in the left ovary felt to be consistent with a hemorrhagic cyst.  Patient's main complaints are tightness and fullness in the pelvis she denies nausea and vomiting.  She does have some pelvic and abdominal pain worse intermittently.  Note that she has had cysts in the past with her infertility treatment.  In 2011 she had vaginal drainage of presumably benign ovarian cyst.  Interval history : Since her last visit with me 06/17/2017 I did take her to the operating room and performed laparotomy with RSO.  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  The initial resection left some residual right utero-ovarian ligament and so I removed that portion of the adnexa once the large mass was resected.  Therefore there are 2 specimens on the pathology report.  She is having some continued abdominal pain but is tolerating diet and having normal urination and having bowel movements.  Measurement of disease:  Recent Labs    06/18/17 1050  CAN125 70.6*    . Pending further work-up   Radiology: . 06/16/2017-TVUS and pelvic ultrasound-[facility]-9 x 4.8 x 5.2 uterus  normal in appearance 8.7 mm endometrium.  Right adnexa reveals a 24 x 15 x 17.7 cm cyst complex cyst multiloculated with thick septations heterogeneous echogenicity of the cystic compartments some areas with vascular septations concerning for malignancy.  Left ovary reveals a 4.3 x 2.1 x 3.8 cm possible hemorrhagic cyst.  Small amount of free fluid in the pelvis.    Oncologic History: Pending further work-up    No history exists.    Current Meds:  Outpatient Encounter Medications as of 06/27/2017  Medication Sig  . acetaminophen (TYLENOL) 500 MG tablet Take 2 tablets (1,000 mg total) by mouth every 6 (six) hours.  Marland Kitchen ibuprofen (ADVIL,MOTRIN) 600 MG tablet Take 1 tablet (600 mg total) by mouth every 6 (six) hours as needed for moderate pain.  Marland Kitchen oxyCODONE (OXY IR/ROXICODONE) 5 MG immediate release tablet Take 1 tablet (5 mg total) by mouth every 4 (four) hours as needed for severe pain or breakthrough pain.  Marland Kitchen senna (SENOKOT) 8.6 MG TABS tablet Take 1 tablet (8.6 mg total) by mouth at bedtime.  . [DISCONTINUED] benzocaine-resorcinol (VAGISIL) 5-2 % vaginal cream Place 1 application vaginally 2 (two) times daily as needed for itching or irritation.   No facility-administered encounter medications on file as of 06/27/2017.     Allergy:  Allergies  Allergen Reactions  . Penicillins Other (See Comments)    REACTION: Mother is allergic so she has never been given any. Tommas Olp CMA  October 16, 2007 10:01 AM  Has patient had a  PCN reaction causing immediate rash, facial/tongue/throat swelling, SOB or lightheadedness with hypotension: No Has patient had a PCN reaction causing severe rash involving mucus membranes or skin necrosis: No Has patient had a PCN reaction that required hospitalization: No Has patient had a PCN reaction occurring within the last 10 years: No If all of the above answe    Social Hx:   Social History   Socioeconomic History  . Marital status: Married     Spouse name: Not on file  . Number of children: Not on file  . Years of education: Not on file  . Highest education level: Not on file  Occupational History  . Not on file  Social Needs  . Financial resource strain: Not on file  . Food insecurity:    Worry: Not on file    Inability: Not on file  . Transportation needs:    Medical: Not on file    Non-medical: Not on file  Tobacco Use  . Smoking status: Current Every Day Smoker    Packs/day: 0.50    Types: Cigarettes  . Smokeless tobacco: Never Used  Substance and Sexual Activity  . Alcohol use: Yes    Comment: 2 / month  . Drug use: No  . Sexual activity: Yes  Lifestyle  . Physical activity:    Days per week: Not on file    Minutes per session: Not on file  . Stress: Not on file  Relationships  . Social connections:    Talks on phone: Not on file    Gets together: Not on file    Attends religious service: Not on file    Active member of club or organization: Not on file    Attends meetings of clubs or organizations: Not on file    Relationship status: Not on file  . Intimate partner violence:    Fear of current or ex partner: Not on file    Emotionally abused: Not on file    Physically abused: Not on file    Forced sexual activity: Not on file  Other Topics Concern  . Not on file  Social History Narrative   Exercising some.     Past Surgical Hx:  Past Surgical History:  Procedure Laterality Date  . ARTHROSCOPIC REPAIR ACL    . CESAREAN SECTION    . LAPAROSCOPIC BILATERAL SALPINGO OOPHERECTOMY N/A 06/19/2017   Procedure: EXPLORATORY LAPAROTOMY RIGHT SALPINGO OOPHORECTOMY;  Surgeon: Isabel Caprice, MD;  Location: [facility];  Service: Gynecology;  Laterality: N/A;  . WISDOM TOOTH EXTRACTION      Past Medical Hx:  Past Medical History:  Diagnosis Date  . Complication of anesthesia    have a hard time waking her up, felt hot, nausea and vomiting  . Foot fracture    age 64  . Headache   . Infertility, female     clomid and stimulation meds in past; treated for years   . PCOS (polycystic ovarian syndrome)   . PONV (postoperative nausea and vomiting)   . Weight gain     Past Gynecological History:   GYNECOLOGIC HISTORY:  Patient's last menstrual period was 05/28/2017 (approximate). Menarche: 32 years old P 1 LMP 3 weeks prior to presentation.  States that occasionally she misses cycles occasionally they will be quite light and then she will have intermittent heavy cycles.  She never cycles twice in a month. Contraceptive oral contraceptives used for 2 years in her teenage years. HRT none  Last Pap June 2017 on  record negative with negative HPV  Family Hx:  Family History  Problem Relation Age of Onset  . Hypothyroidism Mother   . Drug abuse Father   . Hypertension Unknown   . Cancer Paternal Uncle        unkown primary  . Lung cancer Maternal Grandfather        smoker    Review of Systems:  Review of Systems  Constitutional: Positive for fatigue and unexpected weight change.  Gastrointestinal: Positive for abdominal pain.  Genitourinary: Positive for pelvic pain.   Musculoskeletal: Positive for gait problem.  Neurological: Positive for gait problem.  All other systems reviewed and are negative.  Gait problem described as difficulty walking; discomfort  Vitals:  Blood pressure 124/75, pulse 68, temperature 98.2 F (36.8 C), temperature source Oral, resp. rate 20, height 5\' 5"  (1.651 m), weight 220 lb 6.4 oz (100 kg), last menstrual period 05/28/2017, SpO2 99 %. Body mass index is 36.68 kg/m.   Physical Exam:  General :  Well developed, 32 y.o., female in no apparent distress HEENT:  Normocephalic/atraumatic, symmetric, EOMI, eyelids normal Neck:   No visible masses.  Respiratory:  Respirations unlabored, no use of accessory muscles CV:   Deferred Breast:  Deferred Musculoskeletal: Normal muscle strength. Abdomen:  Wound is clean dry and intact. No visible masses or  protrusion Extremities:  No visible edema or deformities Skin:   Normal inspection Neuro/Psych:  No focal motor deficit, no abnormal mental status. Normal gait. Normal affect. Alert and oriented to person, place, and time      Oncologic Summary: 1. Pelvic mass   Assessment/Plan: Thankfully her pathology showed benign process.  No further management recommendations. I did advise her and continued activity restrictions and postoperative wound care. She asked me about return to work and we talked about some of the precautions she should take if she follows through with that plan. Return to clinic in 1 month for final postoperative check CBC was ordered to ensure no anemia.  Isabel Caprice, MD  07/07/2017, 3:48 PM  Cc: Beatrice Lecher, MD (Referring and PCP)

## 2017-07-08 ENCOUNTER — Telehealth: Payer: Self-pay

## 2017-07-08 NOTE — Telephone Encounter (Signed)
Returned pt's call regarding the my chart message she left for Joylene John NP .  Per Lenna Sciara NP - pt can have appt today with her or Dr Gerarda Fraction.  Pt declined appt if that's ok with [facility] NP because the antibiotics are helping.  She is using peroxide.  Per Melissa NP - she can keep [place] updated by the pics on mychart and dilute peroxide to clean and then use neosporin.  Pt will call if it's not improving, noticing signs of infection, and continue to send Melissa NP pics.  No other needs per pt at this time.

## 2017-07-17 ENCOUNTER — Telehealth: Payer: Self-pay | Admitting: *Deleted

## 2017-07-17 ENCOUNTER — Encounter: Payer: Self-pay | Admitting: Family Medicine

## 2017-07-17 NOTE — Telephone Encounter (Signed)
Called and scheduled the patient to see [facility] tomorrow

## 2017-07-18 ENCOUNTER — Encounter: Payer: Self-pay | Admitting: Gynecology

## 2017-07-18 ENCOUNTER — Inpatient Hospital Stay: Payer: 59 | Attending: Obstetrics | Admitting: Gynecology

## 2017-07-18 VITALS — BP 127/78 | HR 76 | Temp 98.2°F | Resp 18 | Ht 65.0 in | Wt 228.0 lb

## 2017-07-18 DIAGNOSIS — Z90722 Acquired absence of ovaries, bilateral: Secondary | ICD-10-CM | POA: Insufficient documentation

## 2017-07-18 DIAGNOSIS — R1900 Intra-abdominal and pelvic swelling, mass and lump, unspecified site: Secondary | ICD-10-CM | POA: Diagnosis not present

## 2017-07-18 DIAGNOSIS — M25552 Pain in left hip: Secondary | ICD-10-CM

## 2017-07-18 DIAGNOSIS — R310 Gross hematuria: Secondary | ICD-10-CM | POA: Insufficient documentation

## 2017-07-18 DIAGNOSIS — D279 Benign neoplasm of unspecified ovary: Secondary | ICD-10-CM | POA: Insufficient documentation

## 2017-07-18 DIAGNOSIS — R1032 Left lower quadrant pain: Secondary | ICD-10-CM

## 2017-07-18 DIAGNOSIS — F17210 Nicotine dependence, cigarettes, uncomplicated: Secondary | ICD-10-CM | POA: Insufficient documentation

## 2017-07-18 NOTE — Progress Notes (Signed)
Consult Note: Gyn-Onc   Valerie Shaw 32 y.o. female  No chief complaint on file.   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  Left lower quadrant pain  Plan: Patient's exam is normal.  Review of the operative note shows the patient had a cyst on that ovary it is certainly possible the cyst could be reforming and causing some pain.  To evaluate this further we will schedule an ultrasound for early next week.  Interval History:  Patient underwent surgery on Jun 19, 2017 for a large right ovarianmucinous cystadenoma.  In addition she had drainage of a left hemorrhagic ovarian cyst.  Initial postoperative course was uncomplicated except for a small wound separation around her umbilicus.  Patient presents today because she is been having left lower quadrant pain for several days.  It is described as "shooting" and intermittent.  Patient is been taking 1000 mg of Tylenol with minimal relief.   HPI: Patient initially presented with a large ovarian cystic mass found to be a mucinous cystadenoma at the time of surgery on Jun 19, 2017.  Final pathology showed this to be a benign mucinous cyst.  She also had a hemorrhagic cyst of the left ovary which was drained.  Initial postoperative course was uncomplicated.  Review of Systems:10 point review of systems is negative except as noted in interval history.   Vitals: There were no vitals taken for this visit.  Physical Exam: General : The patient is a healthy woman in no acute distress.  HEENT: normocephalic, extraoccular movements normal; neck is supple without thyromegally  Lynphnodes: Supraclavicular and inguinal nodes not enlarged  Abdomen: Soft, non-tender, no ascites, no organomegally, no masses, no hernias, midline incisions healing well. Pelvic:  EGBUS: Normal female  Vagina: Normal, no lesions  Urethra and Bladder: Normal, non-tender  Cervix: Normal Uterus: Apparently normal size given the  patient's habitus.  Bi-manual examination: Non-tender; no adenxal masses or nodularity, I do not palpate any masses or cysts. Rectal: normal sphincter tone, no masses, no blood  Lower extremities: No edema or varicosities. Normal range of motion      Allergies  Allergen Reactions  . Penicillins Other (See Comments)    REACTION: Mother is allergic so she has never been given any. [place]  October 16, 2007 10:01 AM  Has patient had a PCN reaction causing immediate rash, facial/tongue/throat swelling, SOB or lightheadedness with hypotension: No Has patient had a PCN reaction causing severe rash involving mucus membranes or skin necrosis: No Has patient had a PCN reaction that required hospitalization: No Has patient had a PCN reaction occurring within the last 10 years: No If all of the above answe    Past Medical History:  Diagnosis Date  . Complication of anesthesia    have a hard time waking her up, felt hot, nausea and vomiting  . Foot fracture    age 65  . Headache   . Infertility, female    clomid and stimulation meds in past; treated for years   . PCOS (polycystic ovarian syndrome)   . PONV (postoperative nausea and vomiting)   . Weight gain     Past Surgical History:  Procedure Laterality Date  . ARTHROSCOPIC REPAIR ACL    . CESAREAN SECTION    . LAPAROSCOPIC BILATERAL SALPINGO OOPHERECTOMY N/A 06/19/2017   Procedure: EXPLORATORY LAPAROTOMY RIGHT SALPINGO OOPHORECTOMY;  Surgeon: Isabel Caprice, MD;  Location: [facility];  Service: Gynecology;  Laterality: N/A;  . WISDOM  TOOTH EXTRACTION      Current Outpatient Medications  Medication Sig Dispense Refill  . acetaminophen (TYLENOL) 500 MG tablet Take 2 tablets (1,000 mg total) by mouth every 6 (six) hours. 30 tablet 0  . cephALEXin (KEFLEX) 500 MG capsule Take 1 capsule (500 mg total) by mouth 4 (four) times daily. 28 capsule 0  . ibuprofen (ADVIL,MOTRIN) 600 MG tablet Take 1 tablet (600 mg total) by  mouth every 6 (six) hours as needed for moderate pain. 30 tablet 0  . oxyCODONE (OXY IR/ROXICODONE) 5 MG immediate release tablet Take 1 tablet (5 mg total) by mouth every 4 (four) hours as needed for severe pain or breakthrough pain. 30 tablet 0  . senna (SENOKOT) 8.6 MG TABS tablet Take 1 tablet (8.6 mg total) by mouth at bedtime. 60 each 0   No current facility-administered medications for this visit.     Social History   Socioeconomic History  . Marital status: Married    Spouse name: Not on file  . Number of children: Not on file  . Years of education: Not on file  . Highest education level: Not on file  Occupational History  . Not on file  Social Needs  . Financial resource strain: Not on file  . Food insecurity:    Worry: Not on file    Inability: Not on file  . Transportation needs:    Medical: Not on file    Non-medical: Not on file  Tobacco Use  . Smoking status: Current Every Day Smoker    Packs/day: 0.50    Types: Cigarettes  . Smokeless tobacco: Never Used  Substance and Sexual Activity  . Alcohol use: Yes    Comment: 2 / month  . Drug use: No  . Sexual activity: Yes  Lifestyle  . Physical activity:    Days per week: Not on file    Minutes per session: Not on file  . Stress: Not on file  Relationships  . Social connections:    Talks on phone: Not on file    Gets together: Not on file    Attends religious service: Not on file    Active member of club or organization: Not on file    Attends meetings of clubs or organizations: Not on file    Relationship status: Not on file  . Intimate partner violence:    Fear of current or ex partner: Not on file    Emotionally abused: Not on file    Physically abused: Not on file    Forced sexual activity: Not on file  Other Topics Concern  . Not on file  Social History Narrative   Exercising some.     Family History  Problem Relation Age of Onset  . Hypothyroidism Mother   . Drug abuse Father   .  Hypertension Unknown   . Cancer Paternal Uncle        unkown primary  . Lung cancer Maternal Grandfather        smoker      Marti Sleigh, MD 07/18/2017, 2:11 PM

## 2017-07-18 NOTE — Patient Instructions (Signed)
Plan to have an ultrasound to evaluate the left ovary and the discomfort you are having.  We will contact you with the results.  Please call the office for any questions or concerns.

## 2017-07-22 ENCOUNTER — Ambulatory Visit (HOSPITAL_COMMUNITY)
Admission: RE | Admit: 2017-07-22 | Discharge: 2017-07-22 | Disposition: A | Discharge status: Home / Self Care | Payer: 59 | Source: Ambulatory Visit | Attending: Gynecologic Oncology | Admitting: Gynecologic Oncology

## 2017-07-22 DIAGNOSIS — R102 Pelvic and perineal pain: Secondary | ICD-10-CM | POA: Diagnosis present

## 2017-07-22 DIAGNOSIS — Z90721 Acquired absence of ovaries, unilateral: Secondary | ICD-10-CM | POA: Insufficient documentation

## 2017-07-22 DIAGNOSIS — M25552 Pain in left hip: Secondary | ICD-10-CM

## 2017-07-23 ENCOUNTER — Telehealth: Payer: Self-pay

## 2017-07-23 NOTE — Telephone Encounter (Signed)
Returned pt's call regarding documents she needs for insurance ie diagnosis from MD, proof of treatment, HIPAA Josem Kaufmann- let pt know she can pick up when she has her appt here on Monday and the her request for bill of procedure and hospital bill with dates/times of admit/discharge- she can call [facility] patient accounting [phone]- pt voiced understanding- no other needs per pt at this time.

## 2017-07-24 NOTE — Progress Notes (Addendum)
Consult Note: Gyn-Onc  Consult was requested by Dr. Beatrice Lecher  CC:  Chief Complaint  Patient presents with  . Pelvic mass    HPI: Ms. Valerie Shaw  is a very nice 32 y.o. P1  She has a long-standing history of infertility and PCOS.  3 days prior to presentation here she noticed some tightening in her abdomen and discomfort.  After 48 hours this did not improve and she scheduled a visit with her primary care provider after taking the day off of work yesterday.  Her primary care provider ordered an ultrasound of the abdomen and pelvis and the patient was performed 06/16/2017 revealing a 24 cm complex cyst presumed adnexal origin.  The ultrasound describes thick septations some of which appear vascular concerns for malignancy.  In addition there was a complex cyst in the left ovary felt to be consistent with a hemorrhagic cyst.  Patient's main complaints are tightness and fullness in the pelvis she denies nausea and vomiting.  She does have some pelvic and abdominal pain worse intermittently.  Note that she has had cysts in the past with her infertility treatment.  In 2011 she had vaginal drainage of presumably benign ovarian cyst.  On 06/17/2017 I did take her to the operating room and performed laparotomy with RSO.  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  The initial resection left some residual right utero-ovarian ligament and so I removed that portion of the adnexa once the large mass was resected.  Therefore there are 2 specimens on the pathology report. In addition a hemorrhagic cyst was noted on her left that was drained intraoperatively.    Interval history : She called last week with LLQ pain and saw Dr. Fermin Schwab. TVUS was done 07/22/17 showing 2.6 cm simple cyst/follicle of no significance in the left ovary and absent right ovary.  She presents for a final postoperative check today. Still has  some LLQ pain. Saw some blood in urine before her period had even started.  States her umbilical incision opened like a pencil eraser size and drained 2 days ago, but now it looks normal.  Radiology: . 06/16/2017-TVUS and pelvic ultrasound-[facility]-9 x 4.8 x 5.2 uterus normal in appearance 8.7 mm endometrium.  Right adnexa reveals a 24 x 15 x 17.7 cm cyst complex cyst multiloculated with thick septations heterogeneous echogenicity of the cystic compartments some areas with vascular septations concerning for malignancy.  Left ovary reveals a 4.3 x 2.1 x 3.8 cm possible hemorrhagic cyst.  Small amount of free fluid in the pelvis.    Current Meds:  Outpatient Encounter Medications as of 07/28/2017  Medication Sig  . acetaminophen (TYLENOL) 500 MG tablet Take 2 tablets (1,000 mg total) by mouth every 6 (six) hours.  Marland Kitchen ibuprofen (ADVIL,MOTRIN) 600 MG tablet Take 1 tablet (600 mg total) by mouth every 6 (six) hours as needed for moderate pain.  Marland Kitchen OVER THE COUNTER MEDICATION Patient has taken medication twice for a UTI.  . [DISCONTINUED] senna (SENOKOT) 8.6 MG TABS tablet Take 1 tablet (8.6 mg total) by mouth at bedtime. (Patient not taking: Reported on 07/28/2017)   No facility-administered encounter medications on file as of 07/28/2017.     Allergy:  Allergies  Allergen Reactions  . Penicillins Other (See Comments)    REACTION: Mother is allergic so she has never been given any. Tommas Olp CMA  October 16, 2007 10:01 AM  Has patient had a  PCN reaction causing immediate rash, facial/tongue/throat swelling, SOB or lightheadedness with hypotension: No Has patient had a PCN reaction causing severe rash involving mucus membranes or skin necrosis: No Has patient had a PCN reaction that required hospitalization: No Has patient had a PCN reaction occurring within the last 10 years: No If all of the above answe    Social Hx:   Social History   Socioeconomic History  . Marital status: Married     Spouse name: Not on file  . Number of children: Not on file  . Years of education: Not on file  . Highest education level: Not on file  Occupational History  . Not on file  Social Needs  . Financial resource strain: Not on file  . Food insecurity:    Worry: Not on file    Inability: Not on file  . Transportation needs:    Medical: Not on file    Non-medical: Not on file  Tobacco Use  . Smoking status: Current Every Day Smoker    Packs/day: 0.50    Types: Cigarettes  . Smokeless tobacco: Never Used  Substance and Sexual Activity  . Alcohol use: Yes    Comment: 2 / month  . Drug use: No  . Sexual activity: Yes  Lifestyle  . Physical activity:    Days per week: Not on file    Minutes per session: Not on file  . Stress: Not on file  Relationships  . Social connections:    Talks on phone: Not on file    Gets together: Not on file    Attends religious service: Not on file    Active member of club or organization: Not on file    Attends meetings of clubs or organizations: Not on file    Relationship status: Not on file  . Intimate partner violence:    Fear of current or ex partner: Not on file    Emotionally abused: Not on file    Physically abused: Not on file    Forced sexual activity: Not on file  Other Topics Concern  . Not on file  Social History Narrative   Exercising some.     Past Surgical Hx:  Past Surgical History:  Procedure Laterality Date  . ARTHROSCOPIC REPAIR ACL    . CESAREAN SECTION    . LAPAROSCOPIC BILATERAL SALPINGO OOPHERECTOMY N/A 06/19/2017   Procedure: EXPLORATORY LAPAROTOMY RIGHT SALPINGO OOPHORECTOMY;  Surgeon: Isabel Caprice, MD;  Location: [facility];  Service: Gynecology;  Laterality: N/A;  . WISDOM TOOTH EXTRACTION      Past Medical Hx:  Past Medical History:  Diagnosis Date  . Complication of anesthesia    have a hard time waking her up, felt hot, nausea and vomiting  . Foot fracture    age 50  . Headache   . Infertility,  female    clomid and stimulation meds in past; treated for years   . PCOS (polycystic ovarian syndrome)   . PONV (postoperative nausea and vomiting)   . Weight gain     Past Gynecological History:   GYNECOLOGIC HISTORY:  No LMP recorded. Menarche: 32 years old P 1 LMP 3 weeks prior to presentation.  States that occasionally she misses cycles occasionally they will be quite light and then she will have intermittent heavy cycles.  She never cycles twice in a month. Contraceptive oral contraceptives used for 2 years in her teenage years. HRT none  Last Pap June 2017 on record negative with negative  HPV  Family Hx:  Family History  Problem Relation Age of Onset  . Hypothyroidism Mother   . Drug abuse Father   . Hypertension Unknown   . Cancer Paternal Uncle        unkown primary  . Lung cancer Maternal Grandfather        smoker    Review of Systems:  Review of Systems  Constitutional: Positive for fatigue and unexpected weight change.  Genitourinary: Positive for hematuria and pelvic pain.   Skin: Positive for itching and wound.   Gait problem described as difficulty walking; discomfort  Vitals:  Blood pressure 124/80, pulse 83, temperature 97.9 F (36.6 C), temperature source Oral, resp. rate 18, height 5\' 5"  (1.651 m), weight 230 lb 9.6 oz (104.6 kg), SpO2 99 %. Body mass index is 38.37 kg/m.   Physical Exam:  General :  Well developed, 32 y.o., female in no apparent distress HEENT:  Normocephalic/atraumatic, symmetric, EOMI, eyelids normal Neck:   No visible masses.  Respiratory:  Respirations unlabored, no use of accessory muscles CV:   Deferred Breast:  Deferred Musculoskeletal: Normal muscle strength. Abdomen:  Incision is near completely healed. No visible masses or protrusion Extremities:  No visible edema or deformities Skin:   Normal inspection Neuro/Psych:  No focal motor deficit, no abnormal mental status. Normal gait. Normal affect. Alert and oriented  to person, place, and time      Assessment/Plan: She will be dispositioned back to her PCP. PCOS per PCP or she can followup with [facility]. If she needs a referral she can call our office. Hematuria- check UA. Consider nephrolithiasis. - per PCP/ She is menstruating today so today's heme won't count as positive.  Isabel Caprice, MD  07/28/2017, 1:19 PM  Cc: Beatrice Lecher, MD (Referring and PCP)

## 2017-07-28 ENCOUNTER — Inpatient Hospital Stay: Payer: 59

## 2017-07-28 ENCOUNTER — Telehealth: Payer: Self-pay

## 2017-07-28 ENCOUNTER — Encounter: Payer: Self-pay | Admitting: Obstetrics

## 2017-07-28 ENCOUNTER — Inpatient Hospital Stay (HOSPITAL_BASED_OUTPATIENT_CLINIC_OR_DEPARTMENT_OTHER): Payer: 59 | Admitting: Obstetrics

## 2017-07-28 VITALS — BP 124/80 | HR 83 | Temp 97.9°F | Resp 18 | Ht 65.0 in | Wt 230.6 lb

## 2017-07-28 DIAGNOSIS — R310 Gross hematuria: Secondary | ICD-10-CM

## 2017-07-28 DIAGNOSIS — R1900 Intra-abdominal and pelvic swelling, mass and lump, unspecified site: Secondary | ICD-10-CM

## 2017-07-28 DIAGNOSIS — R1032 Left lower quadrant pain: Secondary | ICD-10-CM | POA: Diagnosis not present

## 2017-07-28 LAB — URINALYSIS, COMPLETE (UACMP) WITH MICROSCOPIC
BILIRUBIN URINE: NEGATIVE
Bacteria, UA: NONE SEEN
Glucose, UA: NEGATIVE mg/dL
Ketones, ur: NEGATIVE mg/dL
LEUKOCYTES UA: NEGATIVE
NITRITE: NEGATIVE
PH: 5 (ref 5.0–8.0)
Protein, ur: NEGATIVE mg/dL
SPECIFIC GRAVITY, URINE: 1.02 (ref 1.005–1.030)

## 2017-07-28 NOTE — Telephone Encounter (Signed)
Outgoing call per Joylene John NP regarding recent U/A results- "no strong sign of infection but culture is pending to know for sure. Blood was seen, but could be from menstrual cycle"  Pt voiced understanding, then asked when would culture be back, let her know approx 2 days and we would let her know results.  Encouraged pt to drink plenty of water and call us for worsening symptoms- pt voiced understanding.

## 2017-07-28 NOTE — Patient Instructions (Signed)
1) We will contact you with the results of your urine sample from today. 2) Follow up if needed for any issues or concerns in the future.

## 2017-07-29 LAB — URINE CULTURE: Culture: NO GROWTH

## 2017-07-30 ENCOUNTER — Telehealth: Payer: Self-pay

## 2017-07-30 NOTE — Telephone Encounter (Signed)
Outgoing call to pt per Joylene John NP regarding her recent Urine culture results as 'no evidence of infection'.  Pt reports she is feeling better and symptoms are about relieved after drinking more fluids.  Pt said she had discussed possibility of kidney stones at last visit but since symptoms improved she may wait to next yearly appt with her primary in October.  Encouraged pt to seek care if symptoms (ie fever, chills, vomiting, pain, blood in urine) return.  Pt voiced understanding. No other needs per pt at this time.

## 2017-09-26 ENCOUNTER — Telehealth: Payer: Self-pay

## 2017-09-26 MED ORDER — METFORMIN HCL ER 500 MG PO TB24: 500.0000 mg | ORAL_TABLET | Freq: Every day | ORAL | 0 refills | Status: DC

## 2017-09-26 NOTE — Telephone Encounter (Signed)
Unable to leave a message,mailbox is full. 

## 2017-09-26 NOTE — Telephone Encounter (Signed)
Patient advised.

## 2017-09-26 NOTE — Telephone Encounter (Signed)
New rx sent to [facility].

## 2017-09-26 NOTE — Telephone Encounter (Signed)
Valerie Shaw called and states she would like to go ahead and start metformin for her Polycystic ovary syndrome. Please advise.

## 2017-10-21 ENCOUNTER — Ambulatory Visit: Payer: 59 | Admitting: Family Medicine

## 2017-11-17 ENCOUNTER — Ambulatory Visit (HOSPITAL_BASED_OUTPATIENT_CLINIC_OR_DEPARTMENT_OTHER)
Admission: RE | Admit: 2017-11-17 | Discharge: 2017-11-17 | Disposition: A | Discharge status: Home / Self Care | Payer: 59 | Source: Ambulatory Visit | Attending: Family Medicine | Admitting: Family Medicine

## 2017-11-17 ENCOUNTER — Ambulatory Visit (INDEPENDENT_AMBULATORY_CARE_PROVIDER_SITE_OTHER): Payer: 59 | Admitting: Family Medicine

## 2017-11-17 VITALS — BP 136/81 | HR 90 | Ht 65.0 in | Wt 224.0 lb

## 2017-11-17 DIAGNOSIS — R1032 Left lower quadrant pain: Secondary | ICD-10-CM | POA: Diagnosis present

## 2017-11-17 DIAGNOSIS — K5900 Constipation, unspecified: Secondary | ICD-10-CM | POA: Diagnosis not present

## 2017-11-17 NOTE — Progress Notes (Signed)
Subjective:    Patient ID: Valerie Shaw, female    DOB: February 04, 1986, 32 y.o.   MRN: [id number]  HPI 32 year old with a prior history of right benign ovarian mass comes in today complaining of pelvic pain. She says the pain is in the LLQ and is stabbing at times.  He has had a right oophorectomy.  She did have an ultrasound in June showing a small cyst on her left ovary approximately 2.6 cm simple cyst of no significance.  She really has had the pain on and off since about 2 weeks after having her right ovary removed.  But over the last couple weeks her pain has intensified it mostly bothers her at night when she is trying to sleep.  It became so severe couple weeks ago that she actually considered going  to the emergency department.  She really has not had any significant nausea and no vomiting with it.  She says her bowels have been a little bit more constipated than usual.  She is not currently having any pain today.  Review of Systems  There were no vitals taken for this visit.    Allergies  Allergen Reactions  . Penicillins Other (See Comments)    REACTION: Mother is allergic so she has never been given any. [place]  October 16, 2007 10:01 AM  Has patient had a PCN reaction causing immediate rash, facial/tongue/throat swelling, SOB or lightheadedness with hypotension: No Has patient had a PCN reaction causing severe rash involving mucus membranes or skin necrosis: No Has patient had a PCN reaction that required hospitalization: No Has patient had a PCN reaction occurring within the last 10 years: No If all of the above answe    Past Medical History:  Diagnosis Date  . Complication of anesthesia    have a hard time waking her up, felt hot, nausea and vomiting  . Foot fracture    age 32  . Headache   . Infertility, female    clomid and stimulation meds in past; treated for years   . PCOS (polycystic ovarian syndrome)   . PONV (postoperative nausea and  vomiting)   . Weight gain     Past Surgical History:  Procedure Laterality Date  . ARTHROSCOPIC REPAIR ACL    . CESAREAN SECTION    . LAPAROSCOPIC BILATERAL SALPINGO OOPHERECTOMY N/A 06/19/2017   Procedure: EXPLORATORY LAPAROTOMY RIGHT SALPINGO OOPHORECTOMY;  Surgeon: Isabel Caprice, MD;  Location: [facility];  Service: Gynecology;  Laterality: N/A;  . WISDOM TOOTH EXTRACTION      Social History   Socioeconomic History  . Marital status: Married    Spouse name: Not on file  . Number of children: Not on file  . Years of education: Not on file  . Highest education level: Not on file  Occupational History  . Not on file  Social Needs  . Financial resource strain: Not on file  . Food insecurity:    Worry: Not on file    Inability: Not on file  . Transportation needs:    Medical: Not on file    Non-medical: Not on file  Tobacco Use  . Smoking status: Current Every Day Smoker    Packs/day: 0.50    Types: Cigarettes  . Smokeless tobacco: Never Used  Substance and Sexual Activity  . Alcohol use: Yes    Comment: 2 / month  . Drug use: No  . Sexual activity: Yes  Lifestyle  . Physical activity:  Days per week: Not on file    Minutes per session: Not on file  . Stress: Not on file  Relationships  . Social connections:    Talks on phone: Not on file    Gets together: Not on file    Attends religious service: Not on file    Active member of club or organization: Not on file    Attends meetings of clubs or organizations: Not on file    Relationship status: Not on file  . Intimate partner violence:    Fear of current or ex partner: Not on file    Emotionally abused: Not on file    Physically abused: Not on file    Forced sexual activity: Not on file  Other Topics Concern  . Not on file  Social History Narrative   Exercising some.     Family History  Problem Relation Age of Onset  . Hypothyroidism Mother   . Drug abuse Father   . Hypertension Unknown   . Cancer  Paternal Uncle        unkown primary  . Lung cancer Maternal Grandfather        smoker    Outpatient Encounter Medications as of 11/17/2017  Medication Sig  . acetaminophen (TYLENOL) 500 MG tablet Take 2 tablets (1,000 mg total) by mouth every 6 (six) hours.  Marland Kitchen ibuprofen (ADVIL,MOTRIN) 600 MG tablet Take 1 tablet (600 mg total) by mouth every 6 (six) hours as needed for moderate pain.  . metFORMIN (GLUCOPHAGE-XR) 500 MG 24 hr tablet Take 1 tablet (500 mg total) by mouth daily with breakfast.  . OVER THE COUNTER MEDICATION Patient has taken medication twice for a UTI.   No facility-administered encounter medications on file as of 11/17/2017.          Objective:   Physical Exam  Constitutional: She is oriented to person, place, and time. She appears well-developed and well-nourished.  HENT:  Head: Normocephalic and atraumatic.  Cardiovascular: Normal rate, regular rhythm and normal heart sounds.  Pulmonary/Chest: Effort normal and breath sounds normal.  Abdominal: Soft. Bowel sounds are normal. She exhibits no distension and no mass. There is tenderness. There is no rebound and no guarding. No hernia.  TTP in the left lower quadrant.   Neurological: She is alert and oriented to person, place, and time.  Skin: Skin is warm and dry.  Psychiatric: She has a normal mood and affect. Her behavior is normal.        Assessment & Plan:  Left lower quadrant pain-she is tender on exam but no rebound or guarding.  We discussed options including starting with an ultrasound as some lab work.  And then working on getting her bowels moving.  If her pain still persists at that point then we will get a CT of the abdomen and pelvis for further work-up.  Constipation -amended trial of over-the-counter MiraLAX.  Starting with 1 capful mixed with 6 to 8 ounces of fluid twice a day until she has a soft bowel movement.

## 2017-11-18 ENCOUNTER — Encounter: Payer: Self-pay | Admitting: Family Medicine

## 2017-11-18 ENCOUNTER — Ambulatory Visit: Payer: 59 | Admitting: Family Medicine

## 2018-02-16 ENCOUNTER — Encounter: Payer: Self-pay | Admitting: Family Medicine

## 2018-02-16 ENCOUNTER — Ambulatory Visit (INDEPENDENT_AMBULATORY_CARE_PROVIDER_SITE_OTHER): Payer: 59 | Admitting: Family Medicine

## 2018-02-16 VITALS — BP 130/76 | HR 84 | Ht 65.0 in | Wt 226.0 lb

## 2018-02-16 DIAGNOSIS — K5900 Constipation, unspecified: Secondary | ICD-10-CM | POA: Diagnosis not present

## 2018-02-16 DIAGNOSIS — R1030 Lower abdominal pain, unspecified: Secondary | ICD-10-CM | POA: Diagnosis not present

## 2018-02-16 DIAGNOSIS — R1907 Generalized intra-abdominal and pelvic swelling, mass and lump: Secondary | ICD-10-CM | POA: Diagnosis not present

## 2018-02-16 DIAGNOSIS — N912 Amenorrhea, unspecified: Secondary | ICD-10-CM | POA: Diagnosis not present

## 2018-02-16 NOTE — Progress Notes (Signed)
Acute Office Visit  Subjective:    Patient ID: Valerie Shaw, female    DOB: September 10, 1985, 33 y.o.   MRN: [id number]  Chief Complaint  Patient presents with  . Abdominal Pain    pt reports that she feels bloated all the time,nauseated,constipated, tired,and fatigued,taking mirilax and has a buldge above her belly button.she also reports bilateral hand numbness    HPI Patient is in today for 33 year old with a prior history of right benign ovarian mass comes in today complaining of bloating and nausea.  She was seen in October for some similar symptoms at the time she was really more experiencing some left lower quadrant pain that would come and go.  It would be more of a sharp intense pain would last a few seconds and then ease off.  She is still experiencing that pain but says it is just more intense and will usually last 2 to 3 minutes instead of just a few seconds.  But she is more concerned because she is feeling nauseated she is vomiting about twice per week.  She has been consistently using her MiraLAX.  And she just feels very bloated after she eats.   He has had a right oophorectomy for a large abdominal mass.  She did have an ultrasound in June showing a small cyst on her left ovary approximately 2.6 cm simple cyst of no significance.  She really has had the pain on and off since about 2 weeks after having her right ovary removed. She has had persistantly nauseated, bloated and full all the time. She feels tired all the time.  She has been using the miralax.  She is concerned about a bulging in the epigastric area above her incision.  She said she noticed irregularity after the surgery and she thought maybe it was just how her abdomen was sewn back together.  But more recently the area has become swollen and hard.  It does seem to flatten out when she lays on her back.  No fevers chills or sweats.  No worsening or alleviating factors.  She is also not had a menstrual period since May  right after her surgery.  She still has a single left ovary in place.  She also reports bilateral hand numbness  Past Medical History:  Diagnosis Date  . Complication of anesthesia    have a hard time waking her up, felt hot, nausea and vomiting  . Foot fracture    age 3  . Headache   . Infertility, female    clomid and stimulation meds in past; treated for years   . PCOS (polycystic ovarian syndrome)   . PONV (postoperative nausea and vomiting)   . Weight gain     Past Surgical History:  Procedure Laterality Date  . ARTHROSCOPIC REPAIR ACL    . CESAREAN SECTION    . LAPAROSCOPIC BILATERAL SALPINGO OOPHERECTOMY N/A 06/19/2017   Procedure: EXPLORATORY LAPAROTOMY RIGHT SALPINGO OOPHORECTOMY;  Surgeon: Isabel Caprice, MD;  Location: [facility];  Service: Gynecology;  Laterality: N/A;  . WISDOM TOOTH EXTRACTION      Family History  Problem Relation Age of Onset  . Hypothyroidism Mother   . Drug abuse Father   . Hypertension Unknown   . Cancer Paternal Uncle        unkown primary  . Lung cancer Maternal Grandfather        smoker    Social History   Socioeconomic History  . Marital status: Married  Spouse name: Not on file  . Number of children: Not on file  . Years of education: Not on file  . Highest education level: Not on file  Occupational History  . Not on file  Social Needs  . Financial resource strain: Not on file  . Food insecurity:    Worry: Not on file    Inability: Not on file  . Transportation needs:    Medical: Not on file    Non-medical: Not on file  Tobacco Use  . Smoking status: Current Every Day Smoker    Packs/day: 0.50    Types: Cigarettes  . Smokeless tobacco: Never Used  Substance and Sexual Activity  . Alcohol use: Yes    Comment: 2 / month  . Drug use: No  . Sexual activity: Yes  Lifestyle  . Physical activity:    Days per week: Not on file    Minutes per session: Not on file  . Stress: Not on file  Relationships  . Social  connections:    Talks on phone: Not on file    Gets together: Not on file    Attends religious service: Not on file    Active member of club or organization: Not on file    Attends meetings of clubs or organizations: Not on file    Relationship status: Not on file  . Intimate partner violence:    Fear of current or ex partner: Not on file    Emotionally abused: Not on file    Physically abused: Not on file    Forced sexual activity: Not on file  Other Topics Concern  . Not on file  Social History Narrative   Exercising some.     Outpatient Medications Prior to Visit  Medication Sig Dispense Refill  . polyethylene glycol (MIRALAX / GLYCOLAX) packet Take 17 g by mouth daily.    Marland Kitchen acetaminophen (TYLENOL) 500 MG tablet Take 2 tablets (1,000 mg total) by mouth every 6 (six) hours. 30 tablet 0  . ibuprofen (ADVIL,MOTRIN) 600 MG tablet Take 1 tablet (600 mg total) by mouth every 6 (six) hours as needed for moderate pain. 30 tablet 0  . metFORMIN (GLUCOPHAGE-XR) 500 MG 24 hr tablet Take 1 tablet (500 mg total) by mouth daily with breakfast. 90 tablet 0  . OVER THE COUNTER MEDICATION Patient has taken medication twice for a UTI.     No facility-administered medications prior to visit.     Allergies  Allergen Reactions  . Penicillins Other (See Comments)    REACTION: Mother is allergic so she has never been given any. [place]  October 16, 2007 10:01 AM  Has patient had a PCN reaction causing immediate rash, facial/tongue/throat swelling, SOB or lightheadedness with hypotension: No Has patient had a PCN reaction causing severe rash involving mucus membranes or skin necrosis: No Has patient had a PCN reaction that required hospitalization: No Has patient had a PCN reaction occurring within the last 10 years: No If all of the above answe    ROS     Objective:    Physical Exam  Constitutional: She is oriented to person, place, and time. She appears well-developed and  well-nourished.  HENT:  Head: Normocephalic and atraumatic.  Cardiovascular: Normal rate, regular rhythm and normal heart sounds.  Pulmonary/Chest: Effort normal and breath sounds normal.  Abdominal: Soft. Bowel sounds are normal. There is no abdominal tenderness.  She does have a firm bulge in the upper epigastric area above her  incision.  That measures approximately 7 x 8 cm in size.  Nontender on exam.  She has a vertical incision over her lower abdomen that appears to be well-healed.  Neurological: She is alert and oriented to person, place, and time.  Skin: Skin is warm and dry.  Psychiatric: She has a normal mood and affect. Her behavior is normal.    BP 130/76   Pulse 84   Ht 5\' 5"  (1.651 m)   Wt 226 lb (102.5 kg)   SpO2 99%   BMI 37.61 kg/m  Wt Readings from Last 3 Encounters:  02/16/18 226 lb (102.5 kg)  11/18/17 224 lb (101.6 kg)  07/28/17 230 lb 9.6 oz (104.6 kg)    Health Maintenance Due  Topic Date Due  . INFLUENZA VACCINE  09/11/2017    There are no preventive care reminders to display for this patient.   Lab Results  Component Value Date   TSH 1.38 12/16/2016   Lab Results  Component Value Date   WBC 12.9 (H) 06/20/2017   HGB 13.3 06/27/2017   HCT 39.9 06/27/2017   MCV 88.5 06/20/2017   PLT 239 06/20/2017   Lab Results  Component Value Date   NA 136 06/20/2017   K 4.6 06/20/2017   CO2 23 06/20/2017   GLUCOSE 123 (H) 06/20/2017   BUN 12 06/20/2017   CREATININE 0.81 06/20/2017   BILITOT 0.7 06/18/2017   ALKPHOS 75 06/18/2017   AST 19 06/18/2017   ALT 13 (L) 06/18/2017   PROT 7.9 06/18/2017   ALBUMIN 4.1 06/18/2017   CALCIUM 8.9 06/20/2017   ANIONGAP 9 06/20/2017   Lab Results  Component Value Date   CHOL 196 12/16/2016   Lab Results  Component Value Date   HDL 45 (L) 12/16/2016   Lab Results  Component Value Date   LDLCALC 125 (H) 12/16/2016   Lab Results  Component Value Date   TRIG 150 (H) 12/16/2016   Lab Results   Component Value Date   CHOLHDL 4.4 12/16/2016   Lab Results  Component Value Date   HGBA1C 5.1 07/31/2015       Assessment & Plan:   Problem List Items Addressed This Visit    None    Visit Diagnoses    Amenorrhea    -  Primary   Relevant Orders   COMPLETE METABOLIC PANEL WITH GFR   CBC with Differential/Platelet   TSH   Estradiol   Follicle stimulating hormone   Luteinizing hormone   Progesterone   Generalized intra-abdominal and pelvic swelling, mass and lump       Relevant Orders   CT Abdomen Pelvis W Contrast   COMPLETE METABOLIC PANEL WITH GFR   CBC with Differential/Platelet   TSH   Estradiol   Follicle stimulating hormone   Luteinizing hormone   Progesterone   Constipation, unspecified constipation type       Lower abdominal pain         Abdominal wall mass-consider hernia based on exam and history.  Scheduled for CT abdomen for further work-up.  With recent benign tumor removal I also want to make sure that she does not have any sign of any other type of growth, mass, lesion or fluid collection.  Patient-continue with MiraLAX.  Consider other prescription medications depending on what the CT reveals.  Amenorrhea-we will check hormone levels as well as thyroid levels.  LLQ pain - will see if can eval ovary on CT scan.    No orders of  the defined types were placed in this encounter.    Beatrice Lecher, MD

## 2018-02-17 LAB — COMPLETE METABOLIC PANEL WITH GFR
AG RATIO: 1.8 (calc) (ref 1.0–2.5)
ALT: 5 U/L — AB (ref 6–29)
AST: 11 U/L (ref 10–30)
Albumin: 3.7 g/dL (ref 3.6–5.1)
Alkaline phosphatase (APISO): 50 U/L (ref 33–115)
BILIRUBIN TOTAL: 0.5 mg/dL (ref 0.2–1.2)
BUN/Creatinine Ratio: 9 (calc) (ref 6–22)
BUN: 5 mg/dL — ABNORMAL LOW (ref 7–25)
CALCIUM: 9.2 mg/dL (ref 8.6–10.2)
CHLORIDE: 104 mmol/L (ref 98–110)
CO2: 23 mmol/L (ref 20–32)
Creat: 0.57 mg/dL (ref 0.50–1.10)
GFR, Est African American: 141 mL/min/{1.73_m2} (ref >=60)
GFR, Est Non African American: 122 mL/min/{1.73_m2} (ref >=60)
Globulin: 2.1 g/dL (calc) (ref 1.9–3.7)
Glucose, Bld: 81 mg/dL (ref 65–99)
POTASSIUM: 4 mmol/L (ref 3.5–5.3)
Sodium: 134 mmol/L — ABNORMAL LOW (ref 135–146)
Total Protein: 5.8 g/dL — ABNORMAL LOW (ref 6.1–8.1)

## 2018-02-17 LAB — TSH: TSH: 1.03 m[IU]/L

## 2018-02-17 LAB — CBC WITH DIFFERENTIAL/PLATELET
Absolute Monocytes: 465 cells/uL (ref 200–950)
BASOS ABS: 10 {cells}/uL (ref 0–200)
Basophils Relative: 0.1 %
Eosinophils Absolute: 50 cells/uL (ref 15–500)
Eosinophils Relative: 0.5 %
HEMATOCRIT: 35.7 % (ref 35.0–45.0)
Hemoglobin: 12 g/dL (ref 11.7–15.5)
LYMPHS ABS: 1663 {cells}/uL (ref 850–3900)
MCH: 30 pg (ref 27.0–33.0)
MCHC: 33.6 g/dL (ref 32.0–36.0)
MCV: 89.3 fL (ref 80.0–100.0)
MPV: 12.1 fL (ref 7.5–12.5)
Monocytes Relative: 4.7 %
NEUTROS PCT: 77.9 %
Neutro Abs: 7712 cells/uL (ref 1500–7800)
PLATELETS: 208 10*3/uL (ref 140–400)
RBC: 4 10*6/uL (ref 3.80–5.10)
RDW: 12.6 % (ref 11.0–15.0)
TOTAL LYMPHOCYTE: 16.8 %
WBC: 9.9 10*3/uL (ref 3.8–10.8)

## 2018-02-17 LAB — PROGESTERONE: Progesterone: 57.5 ng/mL

## 2018-02-17 LAB — FOLLICLE STIMULATING HORMONE

## 2018-02-17 LAB — ESTRADIOL: ESTRADIOL: 8785 pg/mL — AB

## 2018-02-17 LAB — LUTEINIZING HORMONE: LH: <0.2 m[IU]/mL — ABNORMAL LOW

## 2018-02-18 ENCOUNTER — Ambulatory Visit (INDEPENDENT_AMBULATORY_CARE_PROVIDER_SITE_OTHER): Payer: 59

## 2018-02-18 DIAGNOSIS — R1907 Generalized intra-abdominal and pelvic swelling, mass and lump: Secondary | ICD-10-CM | POA: Diagnosis not present

## 2018-02-18 MED ORDER — IOPAMIDOL (ISOVUE-300) INJECTION 61%
100.0000 mL | Freq: Once | INTRAVENOUS | Status: AC | PRN
  Administered 2018-02-18: 100 mL via INTRAVENOUS

## 2018-02-19 ENCOUNTER — Telehealth: Payer: Self-pay | Admitting: *Deleted

## 2018-02-19 ENCOUNTER — Telehealth: Payer: Self-pay | Admitting: Family Medicine

## 2018-02-19 ENCOUNTER — Other Ambulatory Visit: Payer: Self-pay | Admitting: Family Medicine

## 2018-02-19 ENCOUNTER — Ambulatory Visit (INDEPENDENT_AMBULATORY_CARE_PROVIDER_SITE_OTHER): Payer: 59

## 2018-02-19 DIAGNOSIS — O99281 Endocrine, nutritional and metabolic diseases complicating pregnancy, first trimester: Secondary | ICD-10-CM | POA: Diagnosis not present

## 2018-02-19 DIAGNOSIS — Z3490 Encounter for supervision of normal pregnancy, unspecified, unspecified trimester: Secondary | ICD-10-CM

## 2018-02-19 DIAGNOSIS — Z3A18 18 weeks gestation of pregnancy: Secondary | ICD-10-CM

## 2018-02-19 DIAGNOSIS — O30042 Twin pregnancy, dichorionic/diamniotic, second trimester: Secondary | ICD-10-CM

## 2018-02-19 DIAGNOSIS — E282 Polycystic ovarian syndrome: Secondary | ICD-10-CM

## 2018-02-19 NOTE — Telephone Encounter (Signed)
lvm advising pt that she can take MiraLax.Valerie Shaw, [name]

## 2018-02-19 NOTE — Telephone Encounter (Signed)
Spoke with patient personally.  Please call her back though and let her know that she can take MiraLAX.  She had asked while I was on the phone with her and I never got her a clear answer.  She is safe to use it.

## 2018-02-19 NOTE — Telephone Encounter (Signed)
Pt stopped by the office and asked that I call her.Valerie Shaw, [name]

## 2018-02-19 NOTE — Telephone Encounter (Signed)
Per PCP, OB ordered for dating on multiple pregnancies identified during advanced imaging scan yesterday. Imaging dept notified.

## 2018-02-19 NOTE — Telephone Encounter (Signed)
Called pt she stated that she was very anxious and didn't know if I knew what was going on but she was in her 2nd trimester w/twins and wanted to know exactly how far along she is. She wanted to know if the [place] report was back and if I could tell her the results or did she have to wait until Dr. Madilyn Fireman released these or can I let her know what the results are. She stated that her and her husband did NOT expect this to happen due to what they had been told about her infertility so this was not what they were expecting however, they are happy. I told her that I would see what I could do and get back with her due to Dr. Madilyn Fireman not being in the office today. She voiced understanding and agreed.Elouise Munroe, [place]

## 2018-02-23 ENCOUNTER — Encounter: Payer: Self-pay | Admitting: Obstetrics & Gynecology

## 2018-02-23 ENCOUNTER — Ambulatory Visit (INDEPENDENT_AMBULATORY_CARE_PROVIDER_SITE_OTHER): Payer: 59 | Admitting: Obstetrics & Gynecology

## 2018-02-23 VITALS — BP 126/72 | HR 98 | Wt 223.0 lb

## 2018-02-23 DIAGNOSIS — O0990 Supervision of high risk pregnancy, unspecified, unspecified trimester: Secondary | ICD-10-CM

## 2018-02-23 DIAGNOSIS — Z113 Encounter for screening for infections with a predominantly sexual mode of transmission: Secondary | ICD-10-CM | POA: Diagnosis not present

## 2018-02-23 DIAGNOSIS — Z23 Encounter for immunization: Secondary | ICD-10-CM | POA: Diagnosis not present

## 2018-02-23 DIAGNOSIS — O0991 Supervision of high risk pregnancy, unspecified, first trimester: Secondary | ICD-10-CM

## 2018-02-23 HISTORY — DX: Supervision of high risk pregnancy, unspecified, unspecified trimester: O09.90

## 2018-02-23 MED ORDER — ONDANSETRON HCL 8 MG PO TABS: 8.0000 mg | ORAL_TABLET | Freq: Two times a day (BID) | ORAL | 3 refills | Status: DC

## 2018-02-23 NOTE — Progress Notes (Signed)
  Subjective:    Valerie Shaw is being seen today for her first obstetrical visit.  This is not a planned pregnancy. She is at [redacted]w[redacted]d gestation. Her obstetrical history is significant for obesity and twins. Relationship with FOB: spouse, living together. Patient does intend to breast feed. Pregnancy history fully reviewed.  Patient reports vomiting.  Review of Systems:   Review of Systems  Objective:     BP 126/72   Pulse 98   Wt 223 lb (101.2 kg)   LMP  (LMP Unknown)   BMI 37.11 kg/m  Physical Exam  Exam    Assessment:    Pregnancy: G2P1000 Patient Active Problem List   Diagnosis Date Noted  . Supervision of high risk pregnancy, antepartum 02/23/2018  . Pelvic mass in female 06/19/2017  . Pelvic mass 06/17/2017  . Abnormal weight gain 12/16/2016  . BMI 39.0-39.9,adult 12/16/2016  . Atypical chest pain 07/04/2010  . SHOULDER PAIN, LEFT 04/22/2008       Plan:     Initial labs drawn. Prenatal vitamins. Problem list reviewed and updated. AFP3 discussed: declined. Role of ultrasound in pregnancy discussed; fetal survey: ordered. Amniocentesis discussed: not indicated. Follow up in 4 weeks. Flu vaccine today She declines all genetic testing   Emily Filbert 02/23/2018

## 2018-02-23 NOTE — Progress Notes (Signed)
  Last pap 5/19

## 2018-02-24 ENCOUNTER — Encounter (HOSPITAL_COMMUNITY): Payer: Self-pay

## 2018-02-24 LAB — GC/CHLAMYDIA PROBE AMP (~~LOC~~) NOT AT ARMC
Chlamydia: NEGATIVE
Neisseria Gonorrhea: NEGATIVE

## 2018-02-25 LAB — URINE CULTURE, OB REFLEX

## 2018-02-25 LAB — CULTURE, OB URINE

## 2018-03-03 ENCOUNTER — Ambulatory Visit (HOSPITAL_COMMUNITY)
Admission: RE | Admit: 2018-03-03 | Discharge: 2018-03-03 | Disposition: A | Discharge status: Home / Self Care | Payer: 59 | Source: Ambulatory Visit | Attending: Obstetrics & Gynecology | Admitting: Obstetrics & Gynecology

## 2018-03-03 ENCOUNTER — Encounter (HOSPITAL_COMMUNITY): Payer: Self-pay

## 2018-03-03 DIAGNOSIS — Z3A20 20 weeks gestation of pregnancy: Secondary | ICD-10-CM

## 2018-03-03 DIAGNOSIS — O09893 Supervision of other high risk pregnancies, third trimester: Secondary | ICD-10-CM | POA: Diagnosis not present

## 2018-03-03 DIAGNOSIS — O99212 Obesity complicating pregnancy, second trimester: Secondary | ICD-10-CM | POA: Diagnosis not present

## 2018-03-03 DIAGNOSIS — Z363 Encounter for antenatal screening for malformations: Secondary | ICD-10-CM | POA: Diagnosis not present

## 2018-03-03 DIAGNOSIS — O0932 Supervision of pregnancy with insufficient antenatal care, second trimester: Secondary | ICD-10-CM

## 2018-03-03 DIAGNOSIS — O0990 Supervision of high risk pregnancy, unspecified, unspecified trimester: Secondary | ICD-10-CM | POA: Diagnosis present

## 2018-03-03 DIAGNOSIS — O30032 Twin pregnancy, monochorionic/diamniotic, second trimester: Secondary | ICD-10-CM

## 2018-03-03 LAB — OBSTETRIC PANEL
Absolute Monocytes: 267 cells/uL (ref 200–950)
Antibody Screen: NOT DETECTED
Basophils Absolute: 9 cells/uL (ref 0–200)
Basophils Relative: 0.1 %
Eosinophils Absolute: 27 cells/uL (ref 15–500)
Eosinophils Relative: 0.3 %
HCT: 33.7 % — ABNORMAL LOW (ref 35.0–45.0)
Hemoglobin: 11.7 g/dL (ref 11.7–15.5)
Hepatitis B Surface Ag: NONREACTIVE
LYMPHS ABS: 1673 {cells}/uL (ref 850–3900)
MCH: 31 pg (ref 27.0–33.0)
MCHC: 34.7 g/dL (ref 32.0–36.0)
MCV: 89.4 fL (ref 80.0–100.0)
MPV: 11.8 fL (ref 7.5–12.5)
Monocytes Relative: 3 %
NEUTROS ABS: 6924 {cells}/uL (ref 1500–7800)
Neutrophils Relative %: 77.8 %
Platelets: 214 10*3/uL (ref 140–400)
RBC: 3.77 10*6/uL — AB (ref 3.80–5.10)
RDW: 12.9 % (ref 11.0–15.0)
RPR Ser Ql: NONREACTIVE
Rubella: 2.12 index
Total Lymphocyte: 18.8 %
WBC: 8.9 10*3/uL (ref 3.8–10.8)

## 2018-03-03 LAB — CYSTIC FIBROSIS DIAGNOSTIC STUDY

## 2018-03-03 LAB — HEMOGLOBIN A1C
Hgb A1c MFr Bld: 5.1 % of total Hgb (ref <5.7)
Mean Plasma Glucose: 100 (calc)
eAG (mmol/L): 5.5 (calc)

## 2018-03-04 ENCOUNTER — Other Ambulatory Visit (HOSPITAL_COMMUNITY): Payer: Self-pay | Admitting: *Deleted

## 2018-03-04 DIAGNOSIS — O30032 Twin pregnancy, monochorionic/diamniotic, second trimester: Secondary | ICD-10-CM

## 2018-03-17 ENCOUNTER — Encounter (HOSPITAL_COMMUNITY): Payer: Self-pay

## 2018-03-17 ENCOUNTER — Ambulatory Visit (HOSPITAL_COMMUNITY)
Admission: RE | Admit: 2018-03-17 | Discharge: 2018-03-17 | Disposition: A | Discharge status: Home / Self Care | Payer: 59 | Source: Ambulatory Visit | Attending: Obstetrics & Gynecology | Admitting: Obstetrics & Gynecology

## 2018-03-17 DIAGNOSIS — O0932 Supervision of pregnancy with insufficient antenatal care, second trimester: Secondary | ICD-10-CM

## 2018-03-17 DIAGNOSIS — Z3A22 22 weeks gestation of pregnancy: Secondary | ICD-10-CM

## 2018-03-17 DIAGNOSIS — O99212 Obesity complicating pregnancy, second trimester: Secondary | ICD-10-CM

## 2018-03-17 DIAGNOSIS — Z362 Encounter for other antenatal screening follow-up: Secondary | ICD-10-CM

## 2018-03-17 DIAGNOSIS — O30032 Twin pregnancy, monochorionic/diamniotic, second trimester: Secondary | ICD-10-CM | POA: Diagnosis not present

## 2018-03-17 DIAGNOSIS — O09899 Supervision of other high risk pregnancies, unspecified trimester: Secondary | ICD-10-CM | POA: Diagnosis not present

## 2018-03-27 ENCOUNTER — Ambulatory Visit (INDEPENDENT_AMBULATORY_CARE_PROVIDER_SITE_OTHER): Payer: 59

## 2018-03-27 VITALS — BP 118/77 | HR 77 | Wt 228.0 lb

## 2018-03-27 DIAGNOSIS — O0992 Supervision of high risk pregnancy, unspecified, second trimester: Secondary | ICD-10-CM

## 2018-03-27 DIAGNOSIS — O30032 Twin pregnancy, monochorionic/diamniotic, second trimester: Secondary | ICD-10-CM

## 2018-03-27 DIAGNOSIS — O0990 Supervision of high risk pregnancy, unspecified, unspecified trimester: Secondary | ICD-10-CM

## 2018-03-27 DIAGNOSIS — O30039 Twin pregnancy, monochorionic/diamniotic, unspecified trimester: Secondary | ICD-10-CM

## 2018-03-27 DIAGNOSIS — Z3A23 23 weeks gestation of pregnancy: Secondary | ICD-10-CM

## 2018-03-27 NOTE — Patient Instructions (Signed)

## 2018-03-27 NOTE — Progress Notes (Signed)
   PRENATAL VISIT NOTE  Subjective:  Valerie Shaw is a 33 y.o. G2P1001 at [redacted]w[redacted]d being seen today for ongoing prenatal care.  She is currently monitored for the following issues for this high-risk pregnancy and has SHOULDER PAIN, LEFT; Atypical chest pain; Abnormal weight gain; BMI 39.0-39.9,adult; Pelvic mass; Pelvic mass in female; and Supervision of high risk pregnancy, antepartum on their problem list.  Patient reports no complaints.  Contractions: Not present. Vag. Bleeding: None.  Movement: Present. Denies leaking of fluid.   The following portions of the patient's history were reviewed and updated as appropriate: allergies, current medications, past family history, past medical history, past social history, past surgical history and problem list. Problem list updated.  Objective:   Vitals:   03/27/18 0950  BP: 118/77  Pulse: 77  Weight: 228 lb 0.6 oz (103.4 kg)    Fetal Status: Fetal Heart Rate (bpm): 120/126   Movement: Present     General:  Alert, oriented and cooperative. Patient is in no acute distress.  Skin: Skin is warm and dry. No rash noted.   Cardiovascular: Normal heart rate noted  Respiratory: Normal respiratory effort, no problems with respiration noted  Abdomen: Soft, gravid, appropriate for gestational age.  Pain/Pressure: Present     Pelvic: Cervical exam deferred        Extremities: Normal range of motion.  Edema: Trace  Mental Status: Normal mood and affect. Normal behavior. Normal judgment and thought content.   Assessment and Plan:  Pregnancy: G2P1001 at [redacted]w[redacted]d  1. Supervision of high risk pregnancy, antepartum Discussed using a pregnancy support belt as pregnancy progresses to help with leg and back pain with fetal growth, particularly since patient stands for most of the day at work.  Also, discussed using Tylenol PM or Unisom to help sleep at night for aches and pains after standing.  Encouraged adequate water intake and frequent small meals to help  with nutritional needs of twins.  Discussed options regarding delivery today.  Patient does not want an induction of labor.  Would consider TOLAC if labor begins naturally, otherwise desires repeat cesarean section at 37 weeks per MFM recommendations.  Will scheduled next appt with MD to discuss delivery options further and get repeat c/s scheduled.  Continue routine prenatal care in office at this time. 2 hour GTT at next visit. Anticipatory guidance given.  F/U in 4 weeks.  2. Monochorionic diamniotic twin pregnancy, antepartum Patient has routine ultrasounds with MFM scheduled.  Per last visit with MFM, would recommend delivery at 37 weeks due to Mono/Di twins.  Patient should f/u with them as scheduled.  Preterm labor symptoms and general obstetric precautions including but not limited to vaginal bleeding, contractions, leaking of fluid and fetal movement were reviewed in detail with the patient. Please refer to After Visit Summary for other counseling recommendations.  Return in about 4 weeks (around 04/24/2018) for Return OB visit, 2hr GTT and labs.  Future Appointments  Date Time Provider [place]  04/03/2018  9:30 AM [facility] [place] 5 WH-[facility]  04/13/2018  9:45 AM [facility] [place] 2 [facility]    Elson Clan. Rosana Hoes, SNP

## 2018-04-03 ENCOUNTER — Ambulatory Visit (HOSPITAL_COMMUNITY)
Admission: RE | Admit: 2018-04-03 | Discharge: 2018-04-03 | Disposition: A | Discharge status: Home / Self Care | Payer: 59 | Source: Ambulatory Visit | Attending: Obstetrics & Gynecology | Admitting: Obstetrics & Gynecology

## 2018-04-03 ENCOUNTER — Encounter (HOSPITAL_COMMUNITY): Payer: Self-pay

## 2018-04-03 ENCOUNTER — Other Ambulatory Visit (HOSPITAL_COMMUNITY): Payer: Self-pay | Admitting: *Deleted

## 2018-04-03 DIAGNOSIS — Z3A24 24 weeks gestation of pregnancy: Secondary | ICD-10-CM

## 2018-04-03 DIAGNOSIS — O99212 Obesity complicating pregnancy, second trimester: Secondary | ICD-10-CM

## 2018-04-03 DIAGNOSIS — O0932 Supervision of pregnancy with insufficient antenatal care, second trimester: Secondary | ICD-10-CM

## 2018-04-03 DIAGNOSIS — O09899 Supervision of other high risk pregnancies, unspecified trimester: Secondary | ICD-10-CM

## 2018-04-03 DIAGNOSIS — O30032 Twin pregnancy, monochorionic/diamniotic, second trimester: Secondary | ICD-10-CM | POA: Insufficient documentation

## 2018-04-03 DIAGNOSIS — Z362 Encounter for other antenatal screening follow-up: Secondary | ICD-10-CM

## 2018-04-03 DIAGNOSIS — O30039 Twin pregnancy, monochorionic/diamniotic, unspecified trimester: Secondary | ICD-10-CM

## 2018-04-13 ENCOUNTER — Ambulatory Visit (HOSPITAL_COMMUNITY): Payer: 59 | Admitting: *Deleted

## 2018-04-13 ENCOUNTER — Ambulatory Visit (HOSPITAL_COMMUNITY)
Admission: RE | Admit: 2018-04-13 | Discharge: 2018-04-13 | Disposition: A | Discharge status: Home / Self Care | Payer: 59 | Source: Ambulatory Visit | Attending: Obstetrics and Gynecology | Admitting: Obstetrics and Gynecology

## 2018-04-13 ENCOUNTER — Encounter (HOSPITAL_COMMUNITY): Payer: Self-pay

## 2018-04-13 VITALS — BP 110/77 | HR 87

## 2018-04-13 DIAGNOSIS — Z362 Encounter for other antenatal screening follow-up: Secondary | ICD-10-CM

## 2018-04-13 DIAGNOSIS — O30039 Twin pregnancy, monochorionic/diamniotic, unspecified trimester: Secondary | ICD-10-CM | POA: Insufficient documentation

## 2018-04-13 DIAGNOSIS — O09892 Supervision of other high risk pregnancies, second trimester: Secondary | ICD-10-CM | POA: Diagnosis not present

## 2018-04-13 DIAGNOSIS — O30032 Twin pregnancy, monochorionic/diamniotic, second trimester: Secondary | ICD-10-CM | POA: Insufficient documentation

## 2018-04-13 DIAGNOSIS — O0932 Supervision of pregnancy with insufficient antenatal care, second trimester: Secondary | ICD-10-CM | POA: Diagnosis not present

## 2018-04-13 DIAGNOSIS — Z3A26 26 weeks gestation of pregnancy: Secondary | ICD-10-CM

## 2018-04-13 DIAGNOSIS — O99212 Obesity complicating pregnancy, second trimester: Secondary | ICD-10-CM | POA: Diagnosis not present

## 2018-04-13 NOTE — Progress Notes (Signed)
Pt feeling lightheaded, nausea and dizziness while in lobby.  Cool cloth to head, emesis x 1 small amt, BP wnl.  Pt feeling better back to waiting area.

## 2018-04-27 ENCOUNTER — Other Ambulatory Visit: Payer: Self-pay

## 2018-04-27 ENCOUNTER — Encounter: Payer: Self-pay | Admitting: Obstetrics & Gynecology

## 2018-04-27 ENCOUNTER — Ambulatory Visit (INDEPENDENT_AMBULATORY_CARE_PROVIDER_SITE_OTHER): Payer: 59 | Admitting: Obstetrics & Gynecology

## 2018-04-27 VITALS — BP 119/86 | HR 98 | Wt 232.0 lb

## 2018-04-27 DIAGNOSIS — Z23 Encounter for immunization: Secondary | ICD-10-CM | POA: Diagnosis not present

## 2018-04-27 DIAGNOSIS — Z6839 Body mass index (BMI) 39.0-39.9, adult: Secondary | ICD-10-CM

## 2018-04-27 DIAGNOSIS — Z3A28 28 weeks gestation of pregnancy: Secondary | ICD-10-CM

## 2018-04-27 DIAGNOSIS — O0993 Supervision of high risk pregnancy, unspecified, third trimester: Secondary | ICD-10-CM

## 2018-04-27 DIAGNOSIS — O0990 Supervision of high risk pregnancy, unspecified, unspecified trimester: Secondary | ICD-10-CM

## 2018-04-27 DIAGNOSIS — O30093 Twin pregnancy, unable to determine number of placenta and number of amniotic sacs, third trimester: Secondary | ICD-10-CM

## 2018-04-27 DIAGNOSIS — O30099 Twin pregnancy, unable to determine number of placenta and number of amniotic sacs, unspecified trimester: Secondary | ICD-10-CM

## 2018-04-27 NOTE — Addendum Note (Signed)
Addended by: Emily Filbert on: 04/27/2018 01:06 PM   Modules accepted: Orders

## 2018-04-27 NOTE — Progress Notes (Addendum)
   PRENATAL VISIT NOTE  Subjective:  Valerie Shaw is a 33 y.o. G2P1001 at [redacted]w[redacted]d being seen today for ongoing prenatal care.  She is currently monitored for the following issues for this high-risk pregnancy and has SHOULDER PAIN, LEFT; Atypical chest pain; Abnormal weight gain; BMI 39.0-39.9,adult; Pelvic mass; Pelvic mass in female; Supervision of high risk pregnancy, antepartum; and Twin pregnancy on their problem list.  Patient reports no complaints.  Contractions: Not present. Vag. Bleeding: None.  Movement: Present. Denies leaking of fluid.   The following portions of the patient's history were reviewed and updated as appropriate: allergies, current medications, past family history, past medical history, past social history, past surgical history and problem list.   Objective:   Vitals:   04/27/18 0905  BP: 119/86  Pulse: 98  Weight: 232 lb (105.2 kg)    Fetal Status: Fetal Heart Rate (bpm): 137/131   Movement: Present     General:  Alert, oriented and cooperative. Patient is in no acute distress.  Skin: Skin is warm and dry. No rash noted.   Cardiovascular: Normal heart rate noted  Respiratory: Normal respiratory effort, no problems with respiration noted  Abdomen: Soft, gravid, appropriate for gestational age.  Pain/Pressure: Absent     Pelvic: Cervical exam deferred        Extremities: Normal range of motion.  Edema: Trace  Mental Status: Normal mood and affect. Normal behavior. Normal judgment and thought content.   Assessment and Plan:  Pregnancy: G2P1001 at [redacted]w[redacted]d 1. Supervision of high risk pregnancy, antepartum  - 2Hr GTT w/ 1 Hr Carpenter 75 g - CBC - HIV antibody (with reflex) - RPR - CBC - TDAP  2. BMI 39.0-39.9,adult   3. Twin gestation with indeterminate number of placentas and number of amniotic sacs, antepartum - follow up MFM u/s  Preterm labor symptoms and general obstetric precautions including but not limited to vaginal bleeding, contractions,  leaking of fluid and fetal movement were reviewed in detail with the patient. Please refer to After Visit Summary for other counseling recommendations.   No follow-ups on file.  Future Appointments  Date Time Provider [place]  04/29/2018  9:30 AM [place] [facility]  04/29/2018  9:45 AM [place] 2 [facility]  05/25/2018  9:30 AM Emily Filbert, MD [facility]    Emily Filbert, MD   Will add to [facility]

## 2018-04-28 LAB — CBC
HCT: 35.1 % (ref 35.0–45.0)
Hemoglobin: 11.6 g/dL — ABNORMAL LOW (ref 11.7–15.5)
MCH: 29.5 pg (ref 27.0–33.0)
MCHC: 33 g/dL (ref 32.0–36.0)
MCV: 89.3 fL (ref 80.0–100.0)
MPV: 11.9 fL (ref 7.5–12.5)
PLATELETS: 222 10*3/uL (ref 140–400)
RBC: 3.93 10*6/uL (ref 3.80–5.10)
RDW: 13 % (ref 11.0–15.0)
WBC: 8.7 10*3/uL (ref 3.8–10.8)

## 2018-04-28 LAB — 2HR GTT W 1 HR, CARPENTER, 75 G
Glucose, 1 Hr, Gest: 153 mg/dL (ref 65–179)
Glucose, 2 Hr, Gest: 136 mg/dL (ref 65–152)
Glucose, Fasting, Gest: 79 mg/dL (ref 65–91)

## 2018-04-28 LAB — HIV ANTIBODY (ROUTINE TESTING W REFLEX): HIV 1&2 Ab, 4th Generation: NONREACTIVE

## 2018-04-28 LAB — RPR: RPR Ser Ql: NONREACTIVE

## 2018-04-29 ENCOUNTER — Ambulatory Visit (HOSPITAL_COMMUNITY)
Admission: RE | Admit: 2018-04-29 | Discharge: 2018-04-29 | Disposition: A | Discharge status: Home / Self Care | Payer: 59 | Source: Ambulatory Visit | Attending: Obstetrics and Gynecology | Admitting: Obstetrics and Gynecology

## 2018-04-29 ENCOUNTER — Ambulatory Visit (HOSPITAL_COMMUNITY): Payer: 59 | Admitting: *Deleted

## 2018-04-29 ENCOUNTER — Encounter (HOSPITAL_COMMUNITY): Payer: Self-pay

## 2018-04-29 ENCOUNTER — Other Ambulatory Visit: Payer: Self-pay

## 2018-04-29 VITALS — BP 125/81 | HR 98 | Temp 98.0°F | Wt 233.8 lb

## 2018-04-29 DIAGNOSIS — O30039 Twin pregnancy, monochorionic/diamniotic, unspecified trimester: Secondary | ICD-10-CM | POA: Diagnosis present

## 2018-04-29 DIAGNOSIS — Z3A28 28 weeks gestation of pregnancy: Secondary | ICD-10-CM

## 2018-04-29 DIAGNOSIS — O99213 Obesity complicating pregnancy, third trimester: Secondary | ICD-10-CM

## 2018-04-29 DIAGNOSIS — O09893 Supervision of other high risk pregnancies, third trimester: Secondary | ICD-10-CM

## 2018-04-29 DIAGNOSIS — O0990 Supervision of high risk pregnancy, unspecified, unspecified trimester: Secondary | ICD-10-CM

## 2018-04-29 DIAGNOSIS — O30033 Twin pregnancy, monochorionic/diamniotic, third trimester: Secondary | ICD-10-CM | POA: Diagnosis not present

## 2018-04-29 DIAGNOSIS — Z362 Encounter for other antenatal screening follow-up: Secondary | ICD-10-CM | POA: Diagnosis not present

## 2018-05-01 ENCOUNTER — Other Ambulatory Visit (HOSPITAL_COMMUNITY): Payer: Self-pay | Admitting: *Deleted

## 2018-05-01 DIAGNOSIS — O30033 Twin pregnancy, monochorionic/diamniotic, third trimester: Secondary | ICD-10-CM

## 2018-05-15 ENCOUNTER — Encounter (HOSPITAL_COMMUNITY): Payer: Self-pay

## 2018-05-15 ENCOUNTER — Ambulatory Visit (HOSPITAL_COMMUNITY): Payer: 59

## 2018-05-21 ENCOUNTER — Encounter (HOSPITAL_COMMUNITY): Payer: Self-pay | Admitting: *Deleted

## 2018-05-21 ENCOUNTER — Inpatient Hospital Stay (HOSPITAL_COMMUNITY)
Admission: AD | Admit: 2018-05-21 | Discharge: 2018-06-02 | DRG: 787 | Disposition: A | Discharge status: Home / Self Care | Payer: 59 | Attending: Obstetrics and Gynecology | Admitting: Obstetrics and Gynecology

## 2018-05-21 ENCOUNTER — Telehealth: Payer: Self-pay | Admitting: *Deleted

## 2018-05-21 ENCOUNTER — Inpatient Hospital Stay (HOSPITAL_BASED_OUTPATIENT_CLINIC_OR_DEPARTMENT_OTHER): Payer: 59

## 2018-05-21 ENCOUNTER — Other Ambulatory Visit: Payer: Self-pay

## 2018-05-21 DIAGNOSIS — K219 Gastro-esophageal reflux disease without esophagitis: Secondary | ICD-10-CM | POA: Diagnosis present

## 2018-05-21 DIAGNOSIS — O321XX2 Maternal care for breech presentation, fetus 2: Secondary | ICD-10-CM | POA: Diagnosis present

## 2018-05-21 DIAGNOSIS — O0993 Supervision of high risk pregnancy, unspecified, third trimester: Secondary | ICD-10-CM | POA: Diagnosis not present

## 2018-05-21 DIAGNOSIS — O42113 Preterm premature rupture of membranes, onset of labor more than 24 hours following rupture, third trimester: Secondary | ICD-10-CM

## 2018-05-21 DIAGNOSIS — Z3A31 31 weeks gestation of pregnancy: Secondary | ICD-10-CM

## 2018-05-21 DIAGNOSIS — D279 Benign neoplasm of unspecified ovary: Secondary | ICD-10-CM | POA: Diagnosis present

## 2018-05-21 DIAGNOSIS — O0933 Supervision of pregnancy with insufficient antenatal care, third trimester: Secondary | ICD-10-CM | POA: Diagnosis not present

## 2018-05-21 DIAGNOSIS — O9081 Anemia of the puerperium: Secondary | ICD-10-CM | POA: Diagnosis not present

## 2018-05-21 DIAGNOSIS — Z90721 Acquired absence of ovaries, unilateral: Secondary | ICD-10-CM

## 2018-05-21 DIAGNOSIS — Z98891 History of uterine scar from previous surgery: Secondary | ICD-10-CM

## 2018-05-21 DIAGNOSIS — O30093 Twin pregnancy, unable to determine number of placenta and number of amniotic sacs, third trimester: Secondary | ICD-10-CM

## 2018-05-21 DIAGNOSIS — O321XX1 Maternal care for breech presentation, fetus 1: Secondary | ICD-10-CM | POA: Diagnosis present

## 2018-05-21 DIAGNOSIS — Z880 Allergy status to penicillin: Secondary | ICD-10-CM | POA: Diagnosis not present

## 2018-05-21 DIAGNOSIS — Z3A32 32 weeks gestation of pregnancy: Secondary | ICD-10-CM | POA: Diagnosis not present

## 2018-05-21 DIAGNOSIS — Z6839 Body mass index (BMI) 39.0-39.9, adult: Secondary | ICD-10-CM

## 2018-05-21 DIAGNOSIS — O42919 Preterm premature rupture of membranes, unspecified as to length of time between rupture and onset of labor, unspecified trimester: Secondary | ICD-10-CM

## 2018-05-21 DIAGNOSIS — O42013 Preterm premature rupture of membranes, onset of labor within 24 hours of rupture, third trimester: Secondary | ICD-10-CM | POA: Diagnosis not present

## 2018-05-21 DIAGNOSIS — O42913 Preterm premature rupture of membranes, unspecified as to length of time between rupture and onset of labor, third trimester: Secondary | ICD-10-CM | POA: Diagnosis present

## 2018-05-21 DIAGNOSIS — O34211 Maternal care for low transverse scar from previous cesarean delivery: Secondary | ICD-10-CM | POA: Diagnosis present

## 2018-05-21 DIAGNOSIS — O9962 Diseases of the digestive system complicating childbirth: Secondary | ICD-10-CM | POA: Diagnosis present

## 2018-05-21 DIAGNOSIS — D62 Acute posthemorrhagic anemia: Secondary | ICD-10-CM | POA: Diagnosis not present

## 2018-05-21 DIAGNOSIS — Z87891 Personal history of nicotine dependence: Secondary | ICD-10-CM

## 2018-05-21 DIAGNOSIS — O0930 Supervision of pregnancy with insufficient antenatal care, unspecified trimester: Secondary | ICD-10-CM

## 2018-05-21 DIAGNOSIS — O321XX0 Maternal care for breech presentation, not applicable or unspecified: Secondary | ICD-10-CM | POA: Diagnosis not present

## 2018-05-21 DIAGNOSIS — O0990 Supervision of high risk pregnancy, unspecified, unspecified trimester: Secondary | ICD-10-CM

## 2018-05-21 DIAGNOSIS — O99214 Obesity complicating childbirth: Secondary | ICD-10-CM | POA: Diagnosis present

## 2018-05-21 DIAGNOSIS — O42112 Preterm premature rupture of membranes, onset of labor more than 24 hours following rupture, second trimester: Secondary | ICD-10-CM

## 2018-05-21 DIAGNOSIS — O30033 Twin pregnancy, monochorionic/diamniotic, third trimester: Secondary | ICD-10-CM | POA: Diagnosis not present

## 2018-05-21 DIAGNOSIS — O99213 Obesity complicating pregnancy, third trimester: Secondary | ICD-10-CM

## 2018-05-21 DIAGNOSIS — O30039 Twin pregnancy, monochorionic/diamniotic, unspecified trimester: Secondary | ICD-10-CM | POA: Diagnosis present

## 2018-05-21 HISTORY — DX: Benign neoplasm of unspecified ovary: D27.9

## 2018-05-21 LAB — TYPE AND SCREEN
ABO/RH(D): B POS
Antibody Screen: NEGATIVE

## 2018-05-21 LAB — POCT FERN TEST: POCT Fern Test: POSITIVE

## 2018-05-21 MED ORDER — CALCIUM CARBONATE ANTACID 500 MG PO CHEW
2.0000 | CHEWABLE_TABLET | ORAL | Status: DC | PRN
  Administered 2018-05-21 – 2018-05-23 (×4): 400 mg via ORAL
  Filled 2018-05-21 (×5): qty 2

## 2018-05-21 MED ORDER — AMOXICILLIN 500 MG PO CAPS
500.0000 mg | ORAL_CAPSULE | Freq: Three times a day (TID) | ORAL | Status: AC
  Administered 2018-05-23 – 2018-05-28 (×15): 500 mg via ORAL
  Filled 2018-05-21 (×15): qty 1

## 2018-05-21 MED ORDER — ACETAMINOPHEN 325 MG PO TABS: 650.0000 mg | ORAL_TABLET | ORAL | Status: DC | PRN

## 2018-05-21 MED ORDER — SODIUM CHLORIDE 0.9 % IV SOLN
500.0000 mg | INTRAVENOUS | Status: AC
  Administered 2018-05-21 – 2018-05-22 (×2): 500 mg via INTRAVENOUS
  Filled 2018-05-21 (×3): qty 500

## 2018-05-21 MED ORDER — DOCUSATE SODIUM 100 MG PO CAPS
100.0000 mg | ORAL_CAPSULE | Freq: Every day | ORAL | Status: DC
  Administered 2018-05-22 – 2018-05-30 (×9): 100 mg via ORAL
  Filled 2018-05-21 (×9): qty 1

## 2018-05-21 MED ORDER — LACTATED RINGERS IV SOLN
INTRAVENOUS | Status: DC
  Administered 2018-05-21: 21:00:00 via INTRAVENOUS

## 2018-05-21 MED ORDER — AZITHROMYCIN 250 MG PO TABS
500.0000 mg | ORAL_TABLET | Freq: Every day | ORAL | Status: AC
  Administered 2018-05-23 – 2018-05-27 (×5): 500 mg via ORAL
  Filled 2018-05-21 (×5): qty 2

## 2018-05-21 MED ORDER — ZOLPIDEM TARTRATE 5 MG PO TABS
5.0000 mg | ORAL_TABLET | Freq: Every evening | ORAL | Status: DC | PRN
  Administered 2018-05-22 – 2018-05-27 (×2): 5 mg via ORAL
  Filled 2018-05-21 (×2): qty 1

## 2018-05-21 MED ORDER — BETAMETHASONE SOD PHOS & ACET 6 (3-3) MG/ML IJ SUSP
12.0000 mg | INTRAMUSCULAR | Status: AC
  Administered 2018-05-21 – 2018-05-22 (×2): 12 mg via INTRAMUSCULAR
  Filled 2018-05-21 (×2): qty 2

## 2018-05-21 MED ORDER — PRENATAL MULTIVITAMIN CH
1.0000 | ORAL_TABLET | Freq: Every day | ORAL | Status: DC
  Administered 2018-05-22 – 2018-05-30 (×8): 1 via ORAL
  Filled 2018-05-21 (×8): qty 1

## 2018-05-21 MED ORDER — SODIUM CHLORIDE 0.9 % IV SOLN
2.0000 g | Freq: Four times a day (QID) | INTRAVENOUS | Status: AC
  Administered 2018-05-21 – 2018-05-23 (×8): 2 g via INTRAVENOUS
  Filled 2018-05-21 (×8): qty 2000

## 2018-05-21 NOTE — MAU Note (Addendum)
2 gushes around 4, clear fluid, no bleeding or pain.  No fluid noted on the ride here, lives an hour away.  Back to use restroom.

## 2018-05-21 NOTE — H&P (Signed)
FACULTY PRACTICE ANTEPARTUM ADMISSION HISTORY AND PHYSICAL NOTE   History of Present Illness: Valerie Shaw is a 33 y.o. G2P1001 at [redacted]w[redacted]d with mo/di twins admitted for rupture of membranes. Reports leaking clear fluid since 1550 today. Fluid has been clear. Denies contractions or vaginal bleeding.   Patient reports the fetal movement as active. Patient reports uterine contraction  activity as none. Patient reports  vaginal bleeding as none. Patient describes fluid per vagina as Clear. Fetal presentation is A:breech, B:transverse.  Patient Active Problem List   Diagnosis Date Noted  . Twin pregnancy 04/27/2018  . Supervision of high risk pregnancy, antepartum 02/23/2018  . Pelvic mass in female 06/19/2017  . Pelvic mass 06/17/2017  . Abnormal weight gain 12/16/2016  . BMI 39.0-39.9,adult 12/16/2016  . Atypical chest pain 07/04/2010  . SHOULDER PAIN, LEFT 04/22/2008    Past Medical History:  Diagnosis Date  . Complication of anesthesia    have a hard time waking her up, felt hot, nausea and vomiting  . Foot fracture    age 17  . Headache   . Infertility, female    clomid and stimulation meds in past; treated for years   . PCOS (polycystic ovarian syndrome)   . PCOS (polycystic ovarian syndrome)   . PONV (postoperative nausea and vomiting)   . Supervision of high risk pregnancy, antepartum 02/23/2018    Nursing Staff Provider Office Location  [place] Dating   2nd trimester Language   [place] Anatomy US   Flu Vaccine  02/23/18  Genetic Screen  declines   TDaP vaccine    Hgb A1C or  GTT Early: 5.1 Third trimester  Rhogam   n/a   LAB RESULTS  Feeding Plan  breast/bottle Blood Type B/RH(D) POSITIVE/-- (01/13 1121) B+ Contraception  unsure Antibody NO ANTIBODIES DETECTED (01/13 1121) Circumcision  [place]  . Weight gain     Past Surgical History:  Procedure Laterality Date  . ARTHROSCOPIC REPAIR ACL    . CESAREAN SECTION    . LAPAROSCOPIC BILATERAL SALPINGO OOPHERECTOMY N/A  06/19/2017   Procedure: EXPLORATORY LAPAROTOMY RIGHT SALPINGO OOPHORECTOMY;  Surgeon: Isabel Caprice, MD;  Location: [facility];  Service: Gynecology;  Laterality: N/A;  . WISDOM TOOTH EXTRACTION      OB History  Gravida Para Term Preterm AB Living  2 1 1     1   SAB TAB Ectopic Multiple Live Births          1    # Outcome Date GA Lbr Len/2nd Weight Sex Delivery Anes PTL Lv  2 Current           1 Term 08/06/05     CS-LTranv       Social History   Socioeconomic History  . Marital status: Married    Spouse name: Not on file  . Number of children: Not on file  . Years of education: Not on file  . Highest education level: Not on file  Occupational History  . Not on file  Social Needs  . Financial resource strain: Not on file  . Food insecurity:    Worry: Not on file    Inability: Not on file  . Transportation needs:    Medical: Not on file    Non-medical: Not on file  Tobacco Use  . Smoking status: Former Smoker    Packs/day: 0.50    Types: Cigarettes    Last attempt to quit: 02/17/2018    Years since quitting: 0.2  . Smokeless tobacco: Never Used  .  Tobacco comment: none since she found out she was pregnant  Substance and Sexual Activity  . Alcohol use: Not Currently    Comment: 2 / month  . Drug use: No  . Sexual activity: Yes  Lifestyle  . Physical activity:    Days per week: Not on file    Minutes per session: Not on file  . Stress: Not on file  Relationships  . Social connections:    Talks on phone: Not on file    Gets together: Not on file    Attends religious service: Not on file    Active member of club or organization: Not on file    Attends meetings of clubs or organizations: Not on file    Relationship status: Not on file  Other Topics Concern  . Not on file  Social History Narrative   Exercising some.     Family History  Problem Relation Age of Onset  . Hypothyroidism Mother   . Drug abuse Father   . Hypertension Other   . Cancer Paternal Uncle         unkown primary  . Lung cancer Maternal Grandfather        smoker    Allergies  Allergen Reactions  . Penicillins Other (See Comments)    REACTION: Mother is allergic so she has never been given any. [place]  October 16, 2007 10:01 AM  Has patient had a PCN reaction causing immediate rash, facial/tongue/throat swelling, SOB or lightheadedness with hypotension: No Has patient had a PCN reaction causing severe rash involving mucus membranes or skin necrosis: No Has patient had a PCN reaction that required hospitalization: No Has patient had a PCN reaction occurring within the last 10 years: No If all of the above answe    Medications Prior to Admission  Medication Sig Dispense Refill Last Dose  . ondansetron (ZOFRAN) 8 MG tablet Take 1 tablet (8 mg total) by mouth 2 (two) times daily. (Patient not taking: Reported on 03/03/2018) 30 tablet 3 Not Taking  . polyethylene glycol (MIRALAX / GLYCOLAX) packet Take 17 g by mouth daily.   Taking  . Prenatal Vit w/Fe-Methylfol-FA (PNV PO) Take by mouth.   Taking    Review of Systems - History obtained from the patient General ROS: negative for - chills or fever Gastrointestinal ROS: no abdominal pain, change in bowel habits, or black or bloody stools Genito-Urinary ROS: positive for - vaginal discharge  Vitals:  BP 128/82 (BP Location: Right Arm)   Pulse 94   Temp 98.2 F (36.8 C) (Oral)   Resp 18   Ht 5\' 4"  (1.626 m)   Wt 105.6 kg   LMP  (LMP Unknown)   SpO2 100%   BMI 39.98 kg/m  Physical Examination: CONSTITUTIONAL: Well-developed, well-nourished female in no acute distress.  HENT:  Normocephalic, atraumatic, External right and left ear normal. Oropharynx is clear and moist EYES: Conjunctivae and EOM are normal. Pupils are equal, round, and reactive to light. No scleral icterus.  NECK: Normal range of motion, supple, no masses SKIN: Skin is warm and dry. No rash noted. Not diaphoretic. No erythema. No  pallor. [place]: Alert and oriented to person, place, and time. Normal reflexes, muscle tone coordination. No cranial nerve deficit noted. PSYCHIATRIC: Normal mood and affect. Normal behavior. Normal judgment and thought content. CARDIOVASCULAR: Normal heart rate noted, regular rhythm RESPIRATORY: Effort and breath sounds normal, no problems with respiration noted ABDOMEN: Soft, nontender, nondistended, gravid. MUSCULOSKELETAL: Normal range of  motion. No edema and no tenderness. 2+ distal pulses.  Cervix:  cervical exam deferred Membranes:ruptured, clear fluid. SSE performed, moderate amount of clear fluid pooling.  Fetal Tracing: Baby A Baseline: 135 Variability: moderate Accelerations: 10x10 Decelerations: none  Baby B Baseline: 140 Variability: moderate Accelerations: 15x15 Decelerations: mild variable decel  Tocometer: Flat  Labs:  Results for orders placed or performed during the hospital encounter of 05/21/18 (from the past 24 hour(s))  Fern Test   Collection Time: 05/21/18  7:03 PM  Result Value Ref Range   POCT Fern Test Positive = ruptured amniotic membanes     Imaging Studies: No results found.   Assessment and Plan: 1. Preterm premature rupture of membranes (PPROM) with unknown onset of labor   2. [redacted] weeks gestation of pregnancy   3. Twin gestation, unable to determine number of placenta and number of amniotic sacs in third trimester   -Admit to [facility] Unit -ANCS ordered, first dose in MAU @ 1949 -Dr. Rosana Hoes to enter admission orders  Jorje Guild, NP 05/21/2018 7:13 PM

## 2018-05-21 NOTE — Telephone Encounter (Signed)
Patient called at 3:55pm stating that she had fluid coming out of her for the last 10 min but no contractions, 31 weeks/twins. Patient was advised to go to MAU. She wanted to speak to the nurse. Dr. Hulan Fray called patient back at 3:57pm because she was in the office at the time of the call and advised the same information to the patient, to go to MAU.

## 2018-05-22 ENCOUNTER — Encounter (HOSPITAL_COMMUNITY): Payer: Self-pay | Admitting: Obstetrics and Gynecology

## 2018-05-22 DIAGNOSIS — Z90721 Acquired absence of ovaries, unilateral: Secondary | ICD-10-CM

## 2018-05-22 DIAGNOSIS — O42913 Preterm premature rupture of membranes, unspecified as to length of time between rupture and onset of labor, third trimester: Principal | ICD-10-CM

## 2018-05-22 DIAGNOSIS — Z3A31 31 weeks gestation of pregnancy: Secondary | ICD-10-CM

## 2018-05-22 DIAGNOSIS — D279 Benign neoplasm of unspecified ovary: Secondary | ICD-10-CM | POA: Diagnosis present

## 2018-05-22 DIAGNOSIS — O30039 Twin pregnancy, monochorionic/diamniotic, unspecified trimester: Secondary | ICD-10-CM | POA: Diagnosis present

## 2018-05-22 DIAGNOSIS — O0930 Supervision of pregnancy with insufficient antenatal care, unspecified trimester: Secondary | ICD-10-CM

## 2018-05-22 LAB — ABO/RH: ABO/RH(D): B POS

## 2018-05-22 MED ORDER — FAMOTIDINE 20 MG PO TABS
10.0000 mg | ORAL_TABLET | Freq: Every day | ORAL | Status: DC
  Administered 2018-05-22 – 2018-05-30 (×9): 10 mg via ORAL
  Filled 2018-05-22 (×9): qty 1

## 2018-05-22 MED ORDER — SODIUM CHLORIDE 0.9 % IV SOLN
INTRAVENOUS | Status: DC | PRN
  Administered 2018-05-22: 250 mL via INTRAVENOUS
  Administered 2018-05-22: 200 mL via INTRAVENOUS
  Administered 2018-05-23: 17:00:00 250 mL via INTRAVENOUS
  Administered 2018-05-31: 07:00:00 via INTRAVENOUS

## 2018-05-22 NOTE — Progress Notes (Signed)
[place] ANTEPARTUM PROGRESS NOTE  PEDRO OLDENBURG is a 33 y.o. G2P1001 at [redacted]w[redacted]d who is admitted for PROM.  Estimated Date of Delivery: 07/19/18 Fetal presentation is breech/transverse.  Length of Stay:  1 Days. Admitted 05/21/2018  Subjective:  Patient reports normal fetal movement.  She denies uterine contractions, denies bleeding, reports small amount of leaking. She is uncomfortable from laying on back all night in order to monitor babies, upset that she had to take a sleeping aid. Also with significant reflux. Denies fever/chills/nausea.  Vitals:  Blood pressure (!) 98/58, pulse 81, temperature 97.7 F (36.5 C), temperature source Oral, resp. rate 18, height 5\' 4"  (1.626 m), weight 105.6 kg, SpO2 99 %. Physical Examination: CONSTITUTIONAL: Well-developed, well-nourished female in no acute distress.  HENT:  Normocephalic, atraumatic, External right and left ear normal. Oropharynx is clear and moist EYES: Conjunctivae and EOM are normal. Pupils are equal, round, and reactive to light. No scleral icterus.  NECK: Normal range of motion, supple, no masses. SKIN: Skin is warm and dry. No rash noted. Not diaphoretic. No erythema. No pallor. [place]: Alert and oriented to person, place, and time. Normal reflexes, muscle tone coordination. No cranial nerve deficit noted. PSYCHIATRIC: Normal mood and affect. Normal behavior. Normal judgment and thought content. CARDIOVASCULAR: Normal heart rate noted, regular rhythm RESPIRATORY: Effort normal, no problems with respiration noted MUSCULOSKELETAL: Normal range of motion. No edema and no tenderness. ABDOMEN: Soft, nontender, nondistended, gravid. CERVIX: deferred  Twin A Fetal monitoring: FHR: 120 bpm, Variability: moderate, Accelerations: Present, Decelerations: Absent  Twin B Fetal monitoring: somewhat difficult to monitor FHR: 130 bpm, Variability: moderate, Accelerations: Present, Decelerations: Absent  Uterine activity: no  contractions per hour  Results for orders placed or performed during the hospital encounter of 05/21/18 (from the past 48 hour(s))  Fern Test     Status: Abnormal   Collection Time: 05/21/18  7:03 PM  Result Value Ref Range   POCT Fern Test Positive = ruptured amniotic membanes   Type and screen [place]     Status: None   Collection Time: 05/21/18  8:07 PM  Result Value Ref Range   ABO/RH(D) B POS    Antibody Screen NEG    Sample Expiration      05/24/2018 Performed at [facility], [place] [address], [place], [place] [address]   ABO/Rh     Status: None   Collection Time: 05/21/18  8:07 PM  Result Value Ref Range   ABO/RH(D)      B POS Performed at [place] [address] [place],  [address]      Current scheduled medications . [START ON 05/23/2018] amoxicillin  500 mg Oral Q8H  . [START ON 05/23/2018] azithromycin  500 mg Oral Daily  . betamethasone acetate-betamethasone sodium phosphate  12 mg Intramuscular Q24 Hr x 2  . docusate sodium  100 mg Oral Daily  . prenatal multivitamin  1 tablet Oral Q1200    I have reviewed the patient's current medications.  ASSESSMENT: Active Problems:   BMI 39.0-39.9,adult   Supervision of high risk pregnancy, antepartum   Preterm premature rupture of membranes (PPROM) with unknown onset of labor   Monochorionic diamniotic twin gestation   Late prenatal care   H/O unilateral oophorectomy   PLAN: Cont monitoring for now, will reduce to NST TID if stable Advance diet today  Cont latency abx - no reaction to PCN BTMZ #2 4/10   Continue routine antenatal care.  Feliz Beam, M.D. Attending Center for [employer] (Faculty Practice)  05/22/2018 7:43 AM

## 2018-05-23 DIAGNOSIS — O0993 Supervision of high risk pregnancy, unspecified, third trimester: Secondary | ICD-10-CM

## 2018-05-23 LAB — CBC
HCT: 28.5 % — ABNORMAL LOW (ref 36.0–46.0)
Hemoglobin: 9.7 g/dL — ABNORMAL LOW (ref 12.0–15.0)
MCH: 30.8 pg (ref 26.0–34.0)
MCHC: 34 g/dL (ref 30.0–36.0)
MCV: 90.5 fL (ref 80.0–100.0)
Platelets: 167 10*3/uL (ref 150–400)
RBC: 3.15 MIL/uL — ABNORMAL LOW (ref 3.87–5.11)
RDW: 14.2 % (ref 11.5–15.5)
WBC: 9.9 10*3/uL (ref 4.0–10.5)
nRBC: 0 % (ref 0.0–0.2)

## 2018-05-23 NOTE — Consult Note (Signed)
Neonatology Consult to Antenatal Patient:  I was asked by Dr. Ilda Basset to see this patient in order to provide antenatal counseling due to premature ROM at 69 6/7 weeks with twin gestation.  Ms. Bobst was admitted 2 days ago and is now 76 6/[redacted] weeks GA. She is currently not having active labor. She is getting Ampicillin and Azithromycin, and got Betamethasone 4/9-10. The babies are both female, Twin A is breech, and there has been concordant growth. Patient had late [facility]  I spoke with the patient alone. We discussed the worst case of delivery in the next 1-2 days, including usual DR management, possible respiratory complications and need for support, IV access, feedings (mother desires breast feeding, which was encouraged), LOS, Mortality and Morbidity, and long term outcomes. The plan is for delivery at 34 weeks unless labor begins sooner. She had some questions, which I answered. I would be glad to come back if she has more questions later.  Thank you for asking me to see this patient.  Real Cons, MD Neonatologist  The total length of face-to-face or floor/unit time for this encounter was 30 minutes. Counseling and/or coordination of care was 20 minutes of the above.

## 2018-05-23 NOTE — Progress Notes (Addendum)
Daily Antepartum Note  Admission Date: 05/21/2018 Current Date: 05/23/2018 8:52 AM  Valerie Shaw is a 33 y.o. G2P1001 @ [redacted]w[redacted]d , HD#3, admitted for PPROM of fetus A.  Pregnancy complicated by: Patient Active Problem List   Diagnosis Date Noted  . Monochorionic diamniotic twin gestation 05/22/2018  . Late prenatal care 05/22/2018  . H/O unilateral oophorectomy 05/22/2018  . Mucinous cystadenoma   . Preterm premature rupture of membranes (PPROM) with unknown onset of labor 05/21/2018  . Twin pregnancy 04/27/2018  . Supervision of high risk pregnancy, antepartum 02/23/2018  . Pelvic mass in female 06/19/2017  . Pelvic mass 06/17/2017  . Abnormal weight gain 12/16/2016  . BMI 39.0-39.9,adult 12/16/2016  . Atypical chest pain 07/04/2010  . SHOULDER PAIN, LEFT 04/22/2008    Overnight/24hr events:  none  Subjective:  No s/s of PTL, fever. +LOF (clear)  Objective:    Current Vital Signs 24h Vital Sign Ranges  T 97.9 F (36.6 C) Temp  Avg: 97.9 F (36.6 C)  Min: 97.5 F (36.4 C)  Max: 98.3 F (36.8 C)  BP 111/67 BP  Min: 94/49  Max: 143/83  HR 70 Pulse  Avg: 96.1  Min: 70  Max: 131  RR 18 Resp  Avg: 17.6  Min: 16  Max: 18  SaO2 100 % Room Air SpO2  Avg: 99.7 %  Min: 99 %  Max: 100 %       24 Hour I/O Current Shift I/O  Time Ins Outs 04/10 0701 - 04/11 0700 In: 1176.7 [I.V.:980.6] Out: -  No intake/output data recorded.   A: 125 baseline, +accels, no decel, mod variablity B: 130 baseline, +accels, no decel, mod variability Toco: quiet  Physical exam: General: Well nourished, well developed female in no acute distress. Abdomen: gravid, nttp Cardiovascular: S1, S2 normal, no murmur, rub or gallop, regular rate and rhythm Respiratory: CTAB Extremities: no clubbing, cyanosis or edema Skin: Warm and dry.   Medications: Current Facility-Administered Medications  Medication Dose Route Frequency Provider Last Rate Last Dose  . 0.9 %  sodium chloride infusion    Intravenous PRN Aletha Halim, MD   Stopped at 05/22/18 2315  . acetaminophen (TYLENOL) tablet 650 mg  650 mg Oral Q4H PRN Sloan Leiter, MD      . ampicillin (OMNIPEN) 2 g in sodium chloride 0.9 % 100 mL IVPB  2 g Intravenous Q6H Sloan Leiter, MD 300 mL/hr at 05/23/18 0521 2 g at 05/23/18 0521   Followed by  . amoxicillin (AMOXIL) capsule 500 mg  500 mg Oral Q8H Sloan Leiter, MD      . azithromycin [facility]) tablet 500 mg  500 mg Oral Daily Sloan Leiter, MD      . calcium carbonate (TUMS - dosed in mg elemental calcium) chewable tablet 400 mg of elemental calcium  2 tablet Oral Q4H PRN Sloan Leiter, MD   400 mg of elemental calcium at 05/23/18 0524  . docusate sodium (COLACE) capsule 100 mg  100 mg Oral Daily Sloan Leiter, MD   100 mg at 05/22/18 1020  . famotidine (PEPCID) tablet 10 mg  10 mg Oral Daily Sloan Leiter, MD   10 mg at 05/22/18 1020  . prenatal multivitamin tablet 1 tablet  1 tablet Oral Q1200 Sloan Leiter, MD   1 tablet at 05/22/18 1136  . zolpidem (AMBIEN) tablet 5 mg  5 mg Oral QHS PRN Sloan Leiter, MD   5 mg at 05/22/18 0206  Labs:  No results for input(s): WBC, HGB, HCT, PLT in the last 168 hours.  No results for input(s): NA, K, CL, CO2, BUN, CREATININE, CALCIUM, PROT, BILITOT, ALKPHOS, ALT, AST, GLUCOSE in the last 168 hours.  Invalid input(s): LABALBU   Radiology: no new imaging 4/9: A-breech, afi 2.3, efw 66%, 1962gm, AC 63%//B-transverse, afi 4.6, efw 76%, 2100gm, ac 54%. Discordance 6%  Assessment & Plan:  Pt doing well *Pregnancy: routine care. Doesn't want btl. Continue with daily NSTs *PPROM: continue latency abx. Inpatient until delivery at 34wks at the latest *Preterm: s/p bmz on 4/9 and 4/10. Neo to come see today *PPx: scds, oob ad lib *FEN/GI: sliv, regular diet *Dispo: see above.   Durene Romans MD Attending Center for [facility])

## 2018-05-24 DIAGNOSIS — Z3A32 32 weeks gestation of pregnancy: Secondary | ICD-10-CM

## 2018-05-24 DIAGNOSIS — O42919 Preterm premature rupture of membranes, unspecified as to length of time between rupture and onset of labor, unspecified trimester: Secondary | ICD-10-CM

## 2018-05-24 DIAGNOSIS — O30033 Twin pregnancy, monochorionic/diamniotic, third trimester: Secondary | ICD-10-CM

## 2018-05-24 LAB — CULTURE, BETA STREP (GROUP B ONLY)

## 2018-05-24 NOTE — Progress Notes (Signed)
Patient ID: Valerie Shaw, female   DOB: [birth year], 33 y.o.   MRN: [id number] [place]) NOTE  Valerie Shaw is a 33 y.o. G2P1001 at [redacted]w[redacted]d by best clinical estimate who is admitted for PROM.   Fetal presentation is breech and transverse. Length of Stay:  3  Days  Subjective: Has some lower left quadrant pain, not new. Patient reports the fetal movement as active. Patient reports uterine contraction  activity as none. Patient reports  vaginal bleeding as none. Patient describes fluid per vagina as Clear.  Vitals:  Blood pressure 100/60, pulse 71, temperature 98.1 F (36.7 C), temperature source Oral, resp. rate 15, height 5\' 4"  (1.626 m), weight 105.6 kg, SpO2 99 %. Physical Examination:  General appearance - alert, well appearing, and in no distress Chest - normal effort Abdomen - gravid, non-tender Fundal Height:  size greater than dates Extremities: Homans sign is negative, no sign of DVT  Membranes:ruptured, clear fluid  Fetal Monitoring: A Baseline: 135 bpm, Variability: Good {> 6 bpm), Accelerations: Reactive and Decelerations: Absent B Baseline: 130 bpm, Variability: Good {> 6 bpm), Accelerations: Reactive and Decelerations: Absent Labs:  Results for orders placed or performed during the hospital encounter of 05/21/18 (from the past 24 hour(s))  CBC   Collection Time: 05/23/18  8:18 AM  Result Value Ref Range   WBC 9.9 4.0 - 10.5 K/uL   RBC 3.15 (L) 3.87 - 5.11 MIL/uL   Hemoglobin 9.7 (L) 12.0 - 15.0 g/dL   HCT 28.5 (L) 36.0 - 46.0 %   MCV 90.5 80.0 - 100.0 fL   MCH 30.8 26.0 - 34.0 pg   MCHC 34.0 30.0 - 36.0 g/dL   RDW 14.2 11.5 - 15.5 %   Platelets 167 150 - 400 K/uL   nRBC 0.0 0.0 - 0.2 %    Medications:  Scheduled . amoxicillin  500 mg Oral Q8H  . azithromycin  500 mg Oral Daily  . docusate sodium  100 mg Oral Daily  . famotidine  10 mg Oral Daily  . prenatal multivitamin  1 tablet Oral Q1200   I have reviewed the  patient's current medications.  ASSESSMENT: Active Problems:   BMI 39.0-39.9,adult   Supervision of high risk pregnancy, antepartum   Preterm premature rupture of membranes (PPROM) with unknown onset of labor   Monochorionic diamniotic twin gestation   Late prenatal care   H/O unilateral oophorectomy   Mucinous cystadenoma   PLAN: Continue latency abx S/p BMZ x 2 Delivery w/ s/sx's of labor, infection, worsening maternal or fetal status.  Donnamae Jude, MD 05/24/2018,7:45 AM

## 2018-05-25 ENCOUNTER — Encounter: Payer: 59 | Admitting: Obstetrics & Gynecology

## 2018-05-25 NOTE — Progress Notes (Signed)
Patient ID: PATIENCE NUZZO, female   DOB: 10/01/1985, 33 y.o.   MRN: [id number] [place]) NOTE  DAPHYNE MIGUEZ is a 33 y.o. G2P1001 at [redacted]w[redacted]d by best clinical estimate who is admitted for PPROM of Twin A, has mono-di twin gestation.   Fetal presentation is breech and transverse. Length of Stay:  4  Days  Subjective: Mild cramping overnight, nothing currently. Patient reports the fetal movement as active. Patient reports uterine contraction  activity as none. Patient reports  vaginal bleeding as none. Patient describes fluid per vagina as clear.  Vitals:  Blood pressure 116/68, pulse 64, temperature 97.8 F (36.6 C), temperature source Oral, resp. rate 18, height 5\' 4"  (1.626 m), weight 105.6 kg, SpO2 100 %. Physical Examination: General appearance - alert, well appearing, and in no distress Chest - normal effort Abdomen - gravid, non-tender Fundal Height:  size greater than dates Extremities: Homans sign is negative, no sign of DVT  Membranes:ruptured, clear fluid  Fetal Monitoring: A Baseline: 125 bpm, Variability: Moderate {> 6 bpm), Accelerations: Reactive and Decelerations: Absent B Baseline: 130 bpm, Variability: Goderate {> 6 bpm), Accelerations: Reactive and Decelerations: Absent  Labs:  No results found for this or any previous visit (from the past 24 hour(s)).  Medications:  Scheduled . amoxicillin  500 mg Oral Q8H  . azithromycin  500 mg Oral Daily  . docusate sodium  100 mg Oral Daily  . famotidine  10 mg Oral Daily  . prenatal multivitamin  1 tablet Oral Q1200   I have reviewed the patient's current medications.  ASSESSMENT: Active Problems:   BMI 39.0-39.9,adult   Supervision of high risk pregnancy, antepartum   Preterm premature rupture of membranes (PPROM) with unknown onset of labor   Monochorionic diamniotic twin gestation   Late prenatal care   H/O unilateral oophorectomy   Mucinous cystadenoma   PLAN: Continue  latency antibiotics S/p BMZ x 2 Delivery indicated for signs/symptoms of labor, infection, worsening maternal or fetal status. Routine antenatal care  Verita Schneiders, MD 05/25/2018,7:00 AM

## 2018-05-26 ENCOUNTER — Inpatient Hospital Stay (HOSPITAL_COMMUNITY): Payer: 59

## 2018-05-26 DIAGNOSIS — O99213 Obesity complicating pregnancy, third trimester: Secondary | ICD-10-CM

## 2018-05-26 DIAGNOSIS — Z3A32 32 weeks gestation of pregnancy: Secondary | ICD-10-CM

## 2018-05-26 DIAGNOSIS — O42913 Preterm premature rupture of membranes, unspecified as to length of time between rupture and onset of labor, third trimester: Secondary | ICD-10-CM

## 2018-05-26 DIAGNOSIS — O0933 Supervision of pregnancy with insufficient antenatal care, third trimester: Secondary | ICD-10-CM

## 2018-05-26 DIAGNOSIS — O30033 Twin pregnancy, monochorionic/diamniotic, third trimester: Secondary | ICD-10-CM

## 2018-05-26 LAB — TYPE AND SCREEN
ABO/RH(D): B POS
Antibody Screen: NEGATIVE

## 2018-05-26 NOTE — Progress Notes (Signed)
Patient ID: Valerie Shaw, female   DOB: [birth year], 33 y.o.   MRN: [id number] [place] ANTEPARTUM COMPREHENSIVE PROGRESS NOTE  Valerie Shaw is a 33 y.o. G2P1001 at [redacted]w[redacted]d  who is admitted for PROM of Twin A in setting of mono/di gestation Fetal presentation is breech/transverse. Length of Stay:  5  Days  Subjective: Feels tired this morning.  Patient reports good fetal movement x 2. She reports no uterine contractions, no bleeding and some clear vaginal discharge.  Vitals:  Blood pressure 108/67, pulse 66, temperature 98.1 F (36.7 C), temperature source Oral, resp. rate 18, height 5\' 4"  (1.626 m), weight 105.6 kg, SpO2 100 %.   Physical Examination: Lungs clear Heart RRR Abd soft + BS gravid non tender  Fetal Monitoring:  120-130's baseline with + accels, no ut ctx reassurung for GA x 2  Labs:  Results for orders placed or performed during the hospital encounter of 05/21/18 (from the past 24 hour(s))  Type and screen [place]   Collection Time: 05/26/18  7:49 AM  Result Value Ref Range   ABO/RH(D) B POS    Antibody Screen NEG    Sample Expiration      05/29/2018 Performed at [facility], [place] [address], [place], [place] [address]     Imaging Studies:    BPP x 2 completed today   Medications:  Scheduled . amoxicillin  500 mg Oral Q8H  . azithromycin  500 mg Oral Daily  . docusate sodium  100 mg Oral Daily  . famotidine  10 mg Oral Daily  . prenatal multivitamin  1 tablet Oral Q1200   I have reviewed the patient's current medications.  ASSESSMENT: IUP 32 2/7 weeks Twin gestation Mono/Di PROM Twin A Malpresentation Breech/Transverse  PLAN: S/P BMZ. Continue with latency antibiotics No S/Sx of infection or labor. Delivery at 34 weeks or per maternal/fetal indications Continue routine antenatal care.   [name] 05/26/2018,9:42 AM

## 2018-05-27 NOTE — Progress Notes (Signed)
Patient ID: Valerie Shaw, female   DOB: 14-Jan-1986, 33 y.o.   MRN: [id number] [place] ANTEPARTUM COMPREHENSIVE PROGRESS NOTE  Valerie Shaw is a 33 y.o. G2P1001 at [redacted]w[redacted]d  who is admitted for PROM of Twin A in setting of Mono/Di twins gestation   Fetal presentation is breech/breech Length of Stay:  6  Days  Subjective: Pt without complaints this morning. Patient reports good fetal movement.  She reports no uterine contractions, no bleeding.   Vitals:  Blood pressure 131/79, pulse 72, temperature 97.7 F (36.5 C), temperature source Oral, resp. rate 18, height 5\' 4"  (1.626 m), weight 105.6 kg, SpO2 99 %.   Physical Examination: Lungs clear Heart RRR Abd soft + BS gravid non tender Ext non tender  Fetal Monitoring:  120-140's + accels no ut ctx reactive x 2  Labs:  No results found for this or any previous visit (from the past 24 hour(s)).  Imaging Studies:    See BPP's from yesterday   Medications:  Scheduled . amoxicillin  500 mg Oral Q8H  . docusate sodium  100 mg Oral Daily  . famotidine  10 mg Oral Daily  . prenatal multivitamin  1 tablet Oral Q1200   I have reviewed the patient's current medications.  ASSESSMENT: IUP 32 3/7 weeks Twin gestation, Mono/Di PROM twin A Malpresentation Breech/Breech H/O c section for repeat  PLAN: Stable. S/P BMZ and latency antibiotics. No S/Sx of infection or PTL Delivery at 34 weeks or per maternal/fetal indications. Continue routine antenatal care.   [name] 05/27/2018,10:04 AM

## 2018-05-27 NOTE — Progress Notes (Signed)
Pt refused IV. Dr. Rip Harbour aware.

## 2018-05-27 NOTE — Progress Notes (Addendum)
1117 Monitors switched to represent fetal lie: Baby A now tracing blue, Baby B now tracing orange. Confirmed with [place] report from 4/14. (Baby A was tracing Orange, Baby B was tracing Blue)

## 2018-05-28 NOTE — Progress Notes (Signed)
Patient ID: Valerie Shaw, female   DOB: 31-Dec-1985, 33 y.o.   MRN: [id number] [place] ANTEPARTUM COMPREHENSIVE PROGRESS NOTE  Valerie Shaw is a 33 y.o. G2P1001 at [redacted]w[redacted]d  who is admitted for PROM of Twin A in setting of Mono/Di twin gestation  Fetal presentation is breech/breech Length of Stay:  7  Days  Subjective: Pt without complaints this morning.She reports fetal movement x 2. She denies ut ctx.    Vitals:  Blood pressure 114/60, pulse 83, temperature 97.8 F (36.6 C), temperature source Oral, resp. rate 16, height 5\' 4"  (1.626 m), weight 105.6 kg, SpO2 99 %.   Physical Examination: Lungs clear Heart RRR Abd soft + BS gravid non tender Ext non tender  Fetal Monitoring:  120-130's, + accels, no ut ctx, reactive x 2  Labs:  No results found for this or any previous visit (from the past 24 hour(s)).  Imaging Studies:    NA   Medications:  Scheduled . amoxicillin  500 mg Oral Q8H  . docusate sodium  100 mg Oral Daily  . famotidine  10 mg Oral Daily  . prenatal multivitamin  1 tablet Oral Q1200   I have reviewed the patient's current medications.  ASSESSMENT: IUP 32 4/7 weeks Twin gestation Mono/Di PROM twin A Malpresentation Breech/Breech H/O c section for repeat  PLAN: Stable S/P BMZ and latency antibiotics No S/Sx of PTL or infection Delivery at 34 weeks or for maternal/fetal indications Continue routine antenatal care.   [name] 05/28/2018,9:26 AM

## 2018-05-29 LAB — TYPE AND SCREEN
ABO/RH(D): B POS
Antibody Screen: NEGATIVE

## 2018-05-29 NOTE — Progress Notes (Signed)
Initial Nutrition Assessment  DOCUMENTATION CODES:  Obesity unspecified  INTERVENTION:  Regular Diet Snacks TID, double protein portions upon request  NUTRITION DIAGNOSIS:  Increased nutrient needs related to (twin pregnancy and fetal growth requirements) as evidenced by (32 weeks IUP/twins).  GOAL:  Patient will meet greater than or equal to 90% of their needs  MONITOR:  Weight trends  REASON FOR ASSESSMENT:  Antenatal   ASSESSMENT:  32 5/7 weeks, twins, PROM of twin A. Delivery planned for 34 weeks. Pre-preg wt 222Lbs, BMI 38.3.  10 lb weight gain  Diet Order:   Diet Order            Diet regular Room service appropriate? Yes; Fluid consistency: Thin  Diet effective now             EDUCATION NEEDS:  No education needs have been identified at this time  Height:   Ht Readings from Last 1 Encounters:  05/21/18 5\' 4"  (1.626 m)   Weight:   Wt Readings from Last 1 Encounters:  05/21/18 105.6 kg   Ideal Body Weight:   120 lbs BMI:  Body mass index is 39.98 kg/m.  Estimated Nutritional Needs:   Kcal:  2400-2600  Protein:  105-115 g  Fluid:  2.7 L    Weyman Rodney M.Fredderick Severance LDN Neonatal Nutrition Support Specialist/RD III Pager [phone]      Phone [phone]

## 2018-05-29 NOTE — Progress Notes (Signed)
Patient ID: Valerie Shaw, female   DOB: 1985-05-19, 33 y.o.   MRN: [id number] [place] ANTEPARTUM COMPREHENSIVE PROGRESS NOTE  Valerie Shaw is a 33 y.o. 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 Length of Stay:  8  Days  Subjective: Pt without complaints today. FM X 2. No ut ctx.  Vitals:  Blood pressure 117/63, pulse 82, temperature 98.1 F (36.7 C), temperature source Oral, resp. rate 18, height 5\' 4"  (1.626 m), weight 105.6 kg, SpO2 99 %. Physical Examination: Lungs clear Heart RRR Abd soft + BS gravid non tender Ext non tender  Fetal Monitoring:  130-140's, + accels, no ut ctx, reactive x 2  Labs:  Results for orders placed or performed during the hospital encounter of 05/21/18 (from the past 24 hour(s))  Type and screen [place]   Collection Time: 05/29/18  7:31 AM  Result Value Ref Range   ABO/RH(D) B POS    Antibody Screen NEG    Sample Expiration      06/01/2018 Performed at [facility], [place] [address], [place], [place] [address]     Imaging Studies:    NA   Medications:  Scheduled . docusate sodium  100 mg Oral Daily  . famotidine  10 mg Oral Daily  . prenatal multivitamin  1 tablet Oral Q1200   I have reviewed the patient's current medications.  ASSESSMENT: IUP 32 4/7 weeks Twin gestation Mono/Di PROM Twin A Malpresentation Breech/Breech H/O c section   PLAN: Stable No S/Sx of infection or PTL Delivery at 34 weeks or for maternal/fetal indications Continue routine antenatal care.   [name] 05/29/2018,12:28 PM

## 2018-05-29 NOTE — Progress Notes (Signed)
Pt stated at change of shift that she wiped light greenish discharge during the day shift. She stated discharge did not have a smell to it. RN told pt to save her peripad for RN to assess next time she sees it. Pt vital signs is within normal limits at this time. Will continue to monitor closely.

## 2018-05-30 LAB — CERVICOVAGINAL ANCILLARY ONLY
Bacterial vaginitis: NEGATIVE
Candida vaginitis: NEGATIVE
Chlamydia: NEGATIVE
Neisseria Gonorrhea: NEGATIVE

## 2018-05-30 NOTE — Progress Notes (Signed)
Patient ID: Valerie Shaw, female   DOB: October 30, 1985, 33 y.o.   MRN: [id number] [place] ANTEPARTUM COMPREHENSIVE PROGRESS NOTE  Valerie Shaw is a 33 y.o. G2P1001 at [redacted]w[redacted]d  who is admitted for PROM of Twin A in setting of Mono/Di twin gestation.   Fetal presentation is Breech/Breech.  Length of Stay:  9  Days  Subjective: Pt without complaints this morning. Had some mucous discharge yesterday but none today. Tolerating diet. + FM x 2. Denies ut ctx  Vitals:  Blood pressure 116/63, pulse 80, temperature 98.2 F (36.8 C), temperature source Oral, resp. rate 17, height 5\' 4"  (1.626 m), weight 105.6 kg, SpO2 98 %. Physical Examination: Lungs clear  Heart RRR Abd soft + BS gravid non tender Ext non tender  Fetal Monitoring:  120-130's, + accels, no ut ctx, reactive  Labs:  No results found for this or any previous visit (from the past 24 hour(s)).  Imaging Studies:    NA   Medications:  Scheduled . docusate sodium  100 mg Oral Daily  . famotidine  10 mg Oral Daily  . prenatal multivitamin  1 tablet Oral Q1200   I have reviewed the patient's current medications.  ASSESSMENT: IUP 87 5/7 weeks Twins gestation Mono/Di PROM Twin A Malpresentation Breech/Breech H/O c section  PLAN: Stable No S/Sx of infection or PTL Delivery at 34 weeks or for maternal/fetal indications Continue routine antenatal care.   Valerie Shaw 05/30/2018,12:10 PM

## 2018-05-30 NOTE — Progress Notes (Signed)
PT Note Provided antenatal bedrest handout to RN for patient.  RN reports no further needs from PT at this time. Please reorder as needed.  Thank you, 05/30/2018 Kendrick [place], PT Acute Rehabilitation Services Pager:  [phone] Office:  [phone]

## 2018-05-30 NOTE — Plan of Care (Signed)
  Problem: Education: Goal: Knowledge of General Education information will improve Description Including pain rating scale, medication(s)/side effects and non-pharmacologic comfort measures Outcome: Completed/Met

## 2018-05-31 ENCOUNTER — Inpatient Hospital Stay (HOSPITAL_COMMUNITY): Payer: 59 | Admitting: Anesthesiology

## 2018-05-31 ENCOUNTER — Encounter (HOSPITAL_COMMUNITY)
Admission: AD | Disposition: A | Discharge status: Home / Self Care | Payer: Self-pay | Source: Home / Self Care | Attending: Obstetrics and Gynecology

## 2018-05-31 ENCOUNTER — Encounter (HOSPITAL_COMMUNITY): Payer: Self-pay

## 2018-05-31 DIAGNOSIS — O42013 Preterm premature rupture of membranes, onset of labor within 24 hours of rupture, third trimester: Secondary | ICD-10-CM

## 2018-05-31 DIAGNOSIS — Z98891 History of uterine scar from previous surgery: Secondary | ICD-10-CM

## 2018-05-31 DIAGNOSIS — O30033 Twin pregnancy, monochorionic/diamniotic, third trimester: Secondary | ICD-10-CM

## 2018-05-31 DIAGNOSIS — O321XX0 Maternal care for breech presentation, not applicable or unspecified: Secondary | ICD-10-CM

## 2018-05-31 LAB — CBC WITH DIFFERENTIAL/PLATELET
Abs Immature Granulocytes: 0.07 10*3/uL (ref 0.00–0.07)
Basophils Absolute: 0 10*3/uL (ref 0.0–0.1)
Basophils Relative: 0 %
Eosinophils Absolute: 0 10*3/uL (ref 0.0–0.5)
Eosinophils Relative: 0 %
HCT: 34.9 % — ABNORMAL LOW (ref 36.0–46.0)
Hemoglobin: 11.8 g/dL — ABNORMAL LOW (ref 12.0–15.0)
Immature Granulocytes: 1 %
Lymphocytes Relative: 24 %
Lymphs Abs: 3.7 10*3/uL (ref 0.7–4.0)
MCH: 30.4 pg (ref 26.0–34.0)
MCHC: 33.8 g/dL (ref 30.0–36.0)
MCV: 89.9 fL (ref 80.0–100.0)
Monocytes Absolute: 0.9 10*3/uL (ref 0.1–1.0)
Monocytes Relative: 6 %
Neutro Abs: 10.8 10*3/uL — ABNORMAL HIGH (ref 1.7–7.7)
Neutrophils Relative %: 69 %
Platelets: 202 10*3/uL (ref 150–400)
RBC: 3.88 MIL/uL (ref 3.87–5.11)
RDW: 14.2 % (ref 11.5–15.5)
WBC: 15.5 10*3/uL — ABNORMAL HIGH (ref 4.0–10.5)
nRBC: 0 % (ref 0.0–0.2)

## 2018-05-31 LAB — CREATININE, SERUM
Creatinine, Ser: 0.63 mg/dL (ref 0.44–1.00)
GFR calc Af Amer: >60 mL/min (ref >60)
GFR calc non Af Amer: >60 mL/min (ref >60)

## 2018-05-31 SURGERY — Surgical Case
Anesthesia: Spinal

## 2018-05-31 MED ORDER — SIMETHICONE 80 MG PO CHEW: 80.0000 mg | CHEWABLE_TABLET | ORAL | Status: DC | PRN

## 2018-05-31 MED ORDER — SCOPOLAMINE 1 MG/3DAYS TD PT72
MEDICATED_PATCH | TRANSDERMAL | Status: DC | PRN
  Administered 2018-05-31: 1 via TRANSDERMAL

## 2018-05-31 MED ORDER — IBUPROFEN 600 MG PO TABS
600.0000 mg | ORAL_TABLET | Freq: Four times a day (QID) | ORAL | Status: DC | PRN
  Administered 2018-05-31 – 2018-06-02 (×7): 600 mg via ORAL
  Filled 2018-05-31 (×7): qty 1

## 2018-05-31 MED ORDER — MORPHINE SULFATE (PF) 0.5 MG/ML IJ SOLN
INTRAMUSCULAR | Status: AC
  Filled 2018-05-31: qty 10

## 2018-05-31 MED ORDER — SOD CITRATE-CITRIC ACID 500-334 MG/5ML PO SOLN
ORAL | Status: AC
  Filled 2018-05-31: qty 15

## 2018-05-31 MED ORDER — WITCH HAZEL-GLYCERIN EX PADS: 1.0000 "application " | MEDICATED_PAD | CUTANEOUS | Status: DC | PRN

## 2018-05-31 MED ORDER — FENTANYL CITRATE (PF) 100 MCG/2ML IJ SOLN
INTRAMUSCULAR | Status: DC | PRN
  Administered 2018-05-31: 50 ug via INTRAVENOUS
  Administered 2018-05-31: 100 ug via INTRAVENOUS
  Administered 2018-05-31 (×2): 50 ug via INTRAVENOUS
  Administered 2018-05-31 (×2): 100 ug via INTRAVENOUS

## 2018-05-31 MED ORDER — IBUPROFEN 600 MG PO TABS
600.0000 mg | ORAL_TABLET | Freq: Four times a day (QID) | ORAL | Status: DC | PRN
  Filled 2018-05-31: qty 1

## 2018-05-31 MED ORDER — LACTATED RINGERS IV SOLN
INTRAVENOUS | Status: DC | PRN
  Administered 2018-05-31 (×2): via INTRAVENOUS

## 2018-05-31 MED ORDER — SUCCINYLCHOLINE CHLORIDE 20 MG/ML IJ SOLN
INTRAMUSCULAR | Status: DC | PRN
  Administered 2018-05-31: 140 mg via INTRAVENOUS

## 2018-05-31 MED ORDER — PROPOFOL 10 MG/ML IV BOLUS
INTRAVENOUS | Status: DC | PRN
  Administered 2018-05-31: 200 mg via INTRAVENOUS

## 2018-05-31 MED ORDER — FENTANYL CITRATE (PF) 250 MCG/5ML IJ SOLN
INTRAMUSCULAR | Status: AC
  Filled 2018-05-31: qty 5

## 2018-05-31 MED ORDER — OXYCODONE HCL 5 MG PO TABS
10.0000 mg | ORAL_TABLET | ORAL | Status: DC | PRN
  Administered 2018-05-31 – 2018-06-02 (×11): 10 mg via ORAL
  Filled 2018-05-31 (×11): qty 2

## 2018-05-31 MED ORDER — ONDANSETRON HCL 4 MG/2ML IJ SOLN
INTRAMUSCULAR | Status: DC | PRN
  Administered 2018-05-31: 4 mg via INTRAVENOUS

## 2018-05-31 MED ORDER — PROPOFOL 10 MG/ML IV BOLUS
INTRAVENOUS | Status: AC
  Filled 2018-05-31: qty 20

## 2018-05-31 MED ORDER — SOD CITRATE-CITRIC ACID 500-334 MG/5ML PO SOLN
30.0000 mL | Freq: Once | ORAL | Status: AC
  Administered 2018-05-31: 30 mL via ORAL

## 2018-05-31 MED ORDER — CEFAZOLIN SODIUM-DEXTROSE 2-4 GM/100ML-% IV SOLN
2.0000 g | Freq: Once | INTRAVENOUS | Status: AC
  Administered 2018-05-31: 07:00:00 2 g via INTRAVENOUS
  Filled 2018-05-31: qty 100

## 2018-05-31 MED ORDER — ACETAMINOPHEN 500 MG PO TABS
1000.0000 mg | ORAL_TABLET | Freq: Four times a day (QID) | ORAL | Status: AC
  Administered 2018-05-31 – 2018-06-01 (×3): 1000 mg via ORAL
  Filled 2018-05-31 (×4): qty 2

## 2018-05-31 MED ORDER — SODIUM CHLORIDE 0.9 % IV SOLN
INTRAVENOUS | Status: AC
  Filled 2018-05-31: qty 500

## 2018-05-31 MED ORDER — ONDANSETRON HCL 4 MG/2ML IJ SOLN
INTRAMUSCULAR | Status: AC
  Filled 2018-05-31: qty 2

## 2018-05-31 MED ORDER — MORPHINE SULFATE (PF) 4 MG/ML IV SOLN: 1.0000 mg | INTRAVENOUS | Status: DC | PRN

## 2018-05-31 MED ORDER — COCONUT OIL OIL
1.0000 "application " | TOPICAL_OIL | Status: DC | PRN
  Administered 2018-05-31: 1 via TOPICAL

## 2018-05-31 MED ORDER — DEXAMETHASONE SODIUM PHOSPHATE 10 MG/ML IJ SOLN
INTRAMUSCULAR | Status: DC | PRN
  Administered 2018-05-31: 10 mg via INTRAVENOUS

## 2018-05-31 MED ORDER — DIPHENHYDRAMINE HCL 25 MG PO CAPS: 25.0000 mg | ORAL_CAPSULE | Freq: Four times a day (QID) | ORAL | Status: DC | PRN

## 2018-05-31 MED ORDER — OXYCODONE HCL 5 MG PO TABS: 5.0000 mg | ORAL_TABLET | ORAL | Status: DC | PRN

## 2018-05-31 MED ORDER — MIDAZOLAM HCL 2 MG/2ML IJ SOLN: 0.5000 mg | Freq: Once | INTRAMUSCULAR | Status: DC | PRN

## 2018-05-31 MED ORDER — PRENATAL MULTIVITAMIN CH
1.0000 | ORAL_TABLET | Freq: Every day | ORAL | Status: DC
  Administered 2018-05-31 – 2018-06-02 (×3): 1 via ORAL
  Filled 2018-05-31 (×3): qty 1

## 2018-05-31 MED ORDER — SIMETHICONE 80 MG PO CHEW
80.0000 mg | CHEWABLE_TABLET | Freq: Three times a day (TID) | ORAL | Status: DC
  Administered 2018-05-31 – 2018-06-02 (×5): 80 mg via ORAL
  Filled 2018-05-31 (×5): qty 1

## 2018-05-31 MED ORDER — SIMETHICONE 80 MG PO CHEW
80.0000 mg | CHEWABLE_TABLET | ORAL | Status: DC
  Administered 2018-05-31 – 2018-06-01 (×2): 80 mg via ORAL
  Filled 2018-05-31 (×2): qty 1

## 2018-05-31 MED ORDER — MORPHINE SULFATE (PF) 10 MG/ML IV SOLN
INTRAVENOUS | Status: DC | PRN
  Administered 2018-05-31 (×2): 2 mg via INTRAVENOUS
  Administered 2018-05-31: 1 mg via BUCCAL

## 2018-05-31 MED ORDER — ACETAMINOPHEN 10 MG/ML IV SOLN
1000.0000 mg | Freq: Once | INTRAVENOUS | Status: AC
  Administered 2018-05-31: 1000 mg via INTRAVENOUS

## 2018-05-31 MED ORDER — ACETAMINOPHEN 10 MG/ML IV SOLN
INTRAVENOUS | Status: AC
  Filled 2018-05-31: qty 100

## 2018-05-31 MED ORDER — DEXAMETHASONE SODIUM PHOSPHATE 4 MG/ML IJ SOLN
INTRAMUSCULAR | Status: AC
  Filled 2018-05-31: qty 1

## 2018-05-31 MED ORDER — DIBUCAINE (PERIANAL) 1 % EX OINT: 1.0000 "application " | TOPICAL_OINTMENT | CUTANEOUS | Status: DC | PRN

## 2018-05-31 MED ORDER — MENTHOL 3 MG MT LOZG: 1.0000 | LOZENGE | OROMUCOSAL | Status: DC | PRN

## 2018-05-31 MED ORDER — PHENYLEPHRINE 40 MCG/ML (10ML) SYRINGE FOR IV PUSH (FOR BLOOD PRESSURE SUPPORT)
PREFILLED_SYRINGE | INTRAVENOUS | Status: DC | PRN
  Administered 2018-05-31: 80 ug via INTRAVENOUS

## 2018-05-31 MED ORDER — LACTATED RINGERS IV SOLN
INTRAVENOUS | Status: DC
  Administered 2018-05-31: 17:00:00 via INTRAVENOUS

## 2018-05-31 MED ORDER — FUROSEMIDE 10 MG/ML IJ SOLN
INTRAMUSCULAR | Status: DC | PRN
  Administered 2018-05-31: 10 mg via INTRAMUSCULAR

## 2018-05-31 MED ORDER — KETOROLAC TROMETHAMINE 30 MG/ML IJ SOLN
INTRAMUSCULAR | Status: DC | PRN
  Administered 2018-05-31: 30 mg via INTRAVENOUS

## 2018-05-31 MED ORDER — LIDOCAINE HCL (CARDIAC) PF 100 MG/5ML IV SOSY
PREFILLED_SYRINGE | INTRAVENOUS | Status: DC | PRN
  Administered 2018-05-31: 60 mg via INTRAVENOUS

## 2018-05-31 MED ORDER — TETANUS-DIPHTH-ACELL PERTUSSIS 5-2.5-18.5 LF-MCG/0.5 IM SUSP: 0.5000 mL | Freq: Once | INTRAMUSCULAR | Status: DC

## 2018-05-31 MED ORDER — PROMETHAZINE HCL 25 MG/ML IJ SOLN: 6.2500 mg | INTRAMUSCULAR | Status: DC | PRN

## 2018-05-31 MED ORDER — SODIUM CHLORIDE 0.9 % IR SOLN
Status: DC | PRN
  Administered 2018-05-31: 1000 mL

## 2018-05-31 MED ORDER — FENTANYL CITRATE (PF) 100 MCG/2ML IJ SOLN
INTRAMUSCULAR | Status: AC
  Filled 2018-05-31: qty 2

## 2018-05-31 MED ORDER — OXYTOCIN 40 UNITS IN NORMAL SALINE INFUSION - SIMPLE MED: 2.5000 [IU]/h | INTRAVENOUS | Status: DC

## 2018-05-31 MED ORDER — MEPERIDINE HCL 25 MG/ML IJ SOLN: 6.2500 mg | INTRAMUSCULAR | Status: DC | PRN

## 2018-05-31 MED ORDER — SENNOSIDES-DOCUSATE SODIUM 8.6-50 MG PO TABS
2.0000 | ORAL_TABLET | ORAL | Status: DC
  Administered 2018-05-31 – 2018-06-01 (×2): 2 via ORAL
  Filled 2018-05-31 (×2): qty 2

## 2018-05-31 MED ORDER — ENOXAPARIN SODIUM 40 MG/0.4ML ~~LOC~~ SOLN: 40.0000 mg | SUBCUTANEOUS | Status: DC

## 2018-05-31 MED ORDER — SCOPOLAMINE 1 MG/3DAYS TD PT72
MEDICATED_PATCH | TRANSDERMAL | Status: AC
  Filled 2018-05-31: qty 1

## 2018-05-31 MED ORDER — SODIUM CHLORIDE 0.9 % IV SOLN
500.0000 mg | Freq: Once | INTRAVENOUS | Status: AC
  Administered 2018-05-31: 07:00:00 500 mg via INTRAVENOUS

## 2018-05-31 MED ORDER — ENOXAPARIN SODIUM 60 MG/0.6ML ~~LOC~~ SOLN
50.0000 mg | SUBCUTANEOUS | Status: DC
  Administered 2018-06-01 – 2018-06-02 (×2): 50 mg via SUBCUTANEOUS
  Filled 2018-05-31 (×2): qty 0.6

## 2018-05-31 MED ORDER — MIDAZOLAM HCL 2 MG/2ML IJ SOLN
INTRAMUSCULAR | Status: DC | PRN
  Administered 2018-05-31: 2 mg via INTRAVENOUS

## 2018-05-31 MED ORDER — MIDAZOLAM HCL 2 MG/2ML IJ SOLN
INTRAMUSCULAR | Status: AC
  Filled 2018-05-31: qty 2

## 2018-05-31 MED ORDER — SODIUM CHLORIDE 0.9 % IV SOLN
INTRAVENOUS | Status: DC | PRN
  Administered 2018-05-31: 07:00:00 40 [IU] via INTRAVENOUS

## 2018-05-31 MED ORDER — ALBUMIN HUMAN 5 % IV SOLN
INTRAVENOUS | Status: DC | PRN
  Administered 2018-05-31: 08:00:00 via INTRAVENOUS

## 2018-05-31 MED ORDER — ZOLPIDEM TARTRATE 5 MG PO TABS: 5.0000 mg | ORAL_TABLET | Freq: Every evening | ORAL | Status: DC | PRN

## 2018-05-31 MED ORDER — OXYCODONE-ACETAMINOPHEN 5-325 MG PO TABS: 1.0000 | ORAL_TABLET | ORAL | Status: DC | PRN

## 2018-05-31 MED ORDER — ALBUMIN HUMAN 5 % IV SOLN
INTRAVENOUS | Status: AC
  Filled 2018-05-31: qty 250

## 2018-05-31 SURGICAL SUPPLY — 42 items
BENZOIN TINCTURE PRP APPL 2/3 (GAUZE/BANDAGES/DRESSINGS) ×3 IMPLANT
CHLORAPREP W/TINT 26ML (MISCELLANEOUS) ×3 IMPLANT
CLAMP CORD UMBIL (MISCELLANEOUS) IMPLANT
CLOSURE STERI STRIP 1/2 X4 (GAUZE/BANDAGES/DRESSINGS) ×3 IMPLANT
CLOTH BEACON ORANGE TIMEOUT ST (SAFETY) ×3 IMPLANT
DRAPE C SECTION CLR SCREEN (DRAPES) IMPLANT
DRSG OPSITE POSTOP 4X10 (GAUZE/BANDAGES/DRESSINGS) ×3 IMPLANT
ELECT REM PT RETURN 9FT ADLT (ELECTROSURGICAL) ×3
ELECTRODE REM PT RTRN 9FT ADLT (ELECTROSURGICAL) ×1 IMPLANT
EXTRACTOR VACUUM M CUP 4 TUBE (SUCTIONS) IMPLANT
EXTRACTOR VACUUM M CUP 4' TUBE (SUCTIONS)
GLOVE BIO SURGEON STRL SZ7.5 (GLOVE) ×3 IMPLANT
GLOVE BIOGEL PI IND STRL 7.0 (GLOVE) ×1 IMPLANT
GLOVE BIOGEL PI INDICATOR 7.0 (GLOVE) ×2
GOWN STRL REUS W/TWL 2XL LVL3 (GOWN DISPOSABLE) ×3 IMPLANT
GOWN STRL REUS W/TWL LRG LVL3 (GOWN DISPOSABLE) ×6 IMPLANT
HOVERMATT SINGLE USE (MISCELLANEOUS) ×3 IMPLANT
KIT ABG SYR 3ML LUER SLIP (SYRINGE) IMPLANT
NEEDLE HYPO 22GX1.5 SAFETY (NEEDLE) ×3 IMPLANT
NEEDLE HYPO 25X5/8 SAFETYGLIDE (NEEDLE) IMPLANT
NS IRRIG 1000ML POUR BTL (IV SOLUTION) ×3 IMPLANT
PACK C SECTION WH (CUSTOM PROCEDURE TRAY) ×3 IMPLANT
PAD OB MATERNITY 4.3X12.25 (PERSONAL CARE ITEMS) ×3 IMPLANT
PENCIL SMOKE EVAC W/HOLSTER (ELECTROSURGICAL) ×3 IMPLANT
RETRACTOR TRAXI PANNICULUS (MISCELLANEOUS) ×1 IMPLANT
RTRCTR C-SECT PINK 25CM LRG (MISCELLANEOUS) ×3 IMPLANT
STRIP CLOSURE SKIN 1/2X4 (GAUZE/BANDAGES/DRESSINGS) ×2 IMPLANT
SUT CHROMIC 1 CTX 36 (SUTURE) ×6 IMPLANT
SUT VIC AB 1 CT1 36 (SUTURE) ×6 IMPLANT
SUT VIC AB 2-0 CT1 (SUTURE) ×3 IMPLANT
SUT VIC AB 2-0 CT1 27 (SUTURE) ×2
SUT VIC AB 2-0 CT1 TAPERPNT 27 (SUTURE) ×1 IMPLANT
SUT VIC AB 3-0 CT1 27 (SUTURE) ×4
SUT VIC AB 3-0 CT1 TAPERPNT 27 (SUTURE) ×2 IMPLANT
SUT VIC AB 3-0 SH 27 (SUTURE)
SUT VIC AB 3-0 SH 27X BRD (SUTURE) IMPLANT
SUT VIC AB 4-0 KS 27 (SUTURE) ×3 IMPLANT
SYR BULB IRRIGATION 50ML (SYRINGE) IMPLANT
TOWEL OR 17X24 6PK STRL BLUE (TOWEL DISPOSABLE) ×3 IMPLANT
TRAXI PANNICULUS RETRACTOR (MISCELLANEOUS) ×2
TRAY FOLEY W/BAG SLVR 14FR LF (SET/KITS/TRAYS/PACK) ×3 IMPLANT
WATER STERILE IRR 1000ML POUR (IV SOLUTION) ×3 IMPLANT

## 2018-05-31 NOTE — Anesthesia Preprocedure Evaluation (Addendum)
Anesthesia Evaluation  Patient identified by MRN, date of birth, ID band Patient awake    Reviewed: Allergy & Precautions, NPO status , Patient's Chart, lab work & pertinent test results  Airway Mallampati: II  TM Distance: >3 FB     Dental  (+) Dental Advisory Given   Pulmonary former smoker,    Pulmonary exam normal        Cardiovascular negative cardio ROS Normal cardiovascular exam     Neuro/Psych negative neurological ROS     GI/Hepatic negative GI ROS, Neg liver ROS,   Endo/Other  Morbid obesity  Renal/GU negative Renal ROS     Musculoskeletal   Abdominal   Peds  Hematology negative hematology ROS (+)   Anesthesia Other Findings   Reproductive/Obstetrics (+) Pregnancy (Twin gestation. BabyA breech. Pt in active labor. Was originally 4-5cm now feeling like she has to push.)                            Anesthesia Physical Anesthesia Plan  ASA: III and emergent  Anesthesia Plan: General   Post-op Pain Management:    Induction: Intravenous, Rapid sequence and Cricoid pressure planned  PONV Risk Score and Plan: 3 and 4 or greater and Ondansetron, Treatment may vary due to age or medical condition, Dexamethasone, Scopolamine patch - Pre-op and Midazolam  Airway Management Planned: Oral ETT and Video Laryngoscope Planned  Additional Equipment:   Intra-op Plan:   Post-operative Plan: Extubation in OR  Informed Consent: I have reviewed the patients History and Physical, chart, labs and discussed the procedure including the risks, benefits and alternatives for the proposed anesthesia with the patient or authorized representative who has indicated his/her understanding and acceptance.     Dental advisory given  Plan Discussed with: CRNA  Anesthesia Plan Comments: (C-section changed to stat for presumed complete dilation with pt feeling urge to push and breech babies. Labs  un-resulted at time of escalation to stat c-section. Will do GETA RSI with cricoid since no current platelet count.)       Anesthesia Quick Evaluation

## 2018-05-31 NOTE — Anesthesia Postprocedure Evaluation (Signed)
Anesthesia Post Note  Patient: Valerie Shaw  Procedure(s) Performed: CESAREAN SECTION (N/A )     Patient location during evaluation: PACU Anesthesia Type: General Level of consciousness: awake and alert, patient cooperative and oriented Pain management: pain level controlled Vital Signs Assessment: post-procedure vital signs reviewed and stable Respiratory status: spontaneous breathing, nonlabored ventilation and respiratory function stable Cardiovascular status: blood pressure returned to baseline and stable Postop Assessment: no apparent nausea or vomiting Anesthetic complications: no    Last Vitals:  Vitals:   05/31/18 1045 05/31/18 1145  BP:    Pulse:  62  Resp:  18  Temp:  36.8 C  SpO2: 98% 99%    Last Pain:  Vitals:   05/31/18 1145  TempSrc: Oral  PainSc:    Pain Goal: Patients Stated Pain Goal: 2 (05/31/18 1045)              Epidural/Spinal Function Cutaneous sensation: Normal sensation (05/31/18 1145)  Prim Morace,E. Daylen Hack

## 2018-05-31 NOTE — Progress Notes (Signed)
Patient ID: Valerie Shaw, female   DOB: Oct 27, 1985, 33 y.o.   MRN: [id number] [facility] due to ut ctx  Pt reports ut ctx off/on throughout the night but have become more frequent and painful in the last hr. SVE 4-5 cm dilated with breech presentation. Pt now in active labor at 33 weeks with PROM of Tw A. Tw B breech as well. Will proceed to c section. OR charge notified. R/B of c section reviewed.

## 2018-05-31 NOTE — Anesthesia Procedure Notes (Addendum)
Procedure Name: Intubation Date/Time: 05/31/2018 7:01 AM Performed by: Lieutenant Diego, CRNA Pre-anesthesia Checklist: Patient identified, Emergency Drugs available, Suction available and Patient being monitored Patient Re-evaluated:Patient Re-evaluated prior to induction Oxygen Delivery Method: Circle system utilized Preoxygenation: Pre-oxygenation with 100% oxygen Induction Type: IV induction, Cricoid Pressure applied and Rapid sequence Laryngoscope Size: Glidescope Grade View: Grade I Tube type: Oral Tube size: 7.0 mm Number of attempts: 1 Airway Equipment and Method: Video-laryngoscopy and Stylet Placement Confirmation: ETT inserted through vocal cords under direct vision,  positive ETCO2 and breath sounds checked- equal and bilateral Secured at: 22 cm Tube secured with: Tape Dental Injury: Teeth and Oropharynx as per pre-operative assessment

## 2018-05-31 NOTE — Lactation Note (Signed)
This note was copied from a baby's chart. Lactation Consultation Note  Patient Name: Valerie Shaw IRCVE'L Date: 05/31/2018 Reason for consult: Initial assessment;Other (Comment);Infant < 6lbs;Multiple gestation;Preterm <34wks;NICU baby;Maternal endocrine disorder(IVF) Type of Endocrine Disorder?: PCOS  Visited with mom of 4 hours old NICU pre-term twins < 5 lbs. RN Maudie Mercury asked LC to see mom, mom has already requested to be seen because she had questions. Mom is a P3, she has a 34 y.o daughter whom she BF for 2 weeks, mom said she even hired a LC and used a 75F feeding tube to supplement at the breast with that baby but she kept loosing weight and mom had to give up after 2 weeks, she had a very low milk supply, she never experienced breast changes during that pregnancy, she had PCOS.   However, with this pregnancy, she experienced some breast changes, not really in size but in color (darker) and they also became more sensitive. Mom is familiar with hand expression and has seen some tiny "droplets" which she describes more like "moisture" coming out of her left breast. Mom started pumping right away, when [facility] came into the room, she had already finished her first pumping session, praised her for her efforts.  Mom understands that pumping at this early stage is mainly for breast stimulation and not to get volume; she has realistic expectations to avoid getting discouraged. She doesn't have a pump at home at the moment but she's planning on getting one through her insurance, she has Garment/textile technologist. Dad present and supportive, she said he'll call the insurance company today, they both understand how important is to have a DEBP at the time of mom's discharge. She does not get WIC, her babies were conceived through IVF.  Mom was very tired, but she had lots of questions. Discussed pumping, milk storage guidelines for NICU babies, galactagogues, milk supply, PCOS and infertility impact on lactation.   Feeding  plan:  1. Encouraged mom to pump at least 8 times/24 hours 2. Once she starts getting drops/volume; she'll store it in her fridge room until she can take it to the NICU for her babies  BF brochure and pumping log and providing breastmilk for your NICU baby were reviewed. Parents reported all questions and concerns were answered, they're both aware of [facility] OP services and will call lactation is they have any other questions/concerns.  Maternal Data Formula Feeding for Exclusion: Yes Reason for exclusion: Mother's choice to formula and breast feed on admission Has patient been taught Hand Expression?: Yes Does the patient have breastfeeding experience prior to this delivery?: Yes  Feeding    Interventions Interventions: Breast feeding basics reviewed  Lactation Tools Discussed/Used Tools: Pump Breast pump type: Double-Electric Breast Pump WIC Program: No Pump Review: Setup, frequency, and cleaning;Milk Storage Initiated by:: RN  Date initiated:: 05/31/18   Consult Status Consult Status: PRN Follow-up type: In-patient    Valerie Shaw Valerie Shaw 05/31/2018, 12:03 PM

## 2018-05-31 NOTE — Transfer of Care (Signed)
Immediate Anesthesia Transfer of Care Note  Patient: Valerie Shaw  Procedure(s) Performed: CESAREAN SECTION (N/A )  Patient Location: PACU  Anesthesia Type:General  Level of Consciousness: awake, alert  and oriented  Airway & Oxygen Therapy: Patient Spontanous Breathing and Patient connected to nasal cannula oxygen  Post-op Assessment: Report given to RN and Post -op Vital signs reviewed and stable  Post vital signs: Reviewed and stable  Last Vitals:  Vitals Value Taken Time  BP 121/87 05/31/2018  8:17 AM  Temp    Pulse 97 05/31/2018  8:21 AM  Resp 16 05/31/2018  8:21 AM  SpO2 100 % 05/31/2018  8:21 AM  Vitals shown include unvalidated device data.  Last Pain:  Vitals:   05/31/18 0614  TempSrc: Oral  PainSc:       Patients Stated Pain Goal: 2 (22/17/98 1025)  Complications: No apparent anesthesia complications

## 2018-05-31 NOTE — Op Note (Signed)
Cesarean Section Procedure Note  05/31/2018  8:49 AM  PATIENT:  Valerie Shaw  33 y.o. female  PRE-OPERATIVE DIAGNOSIS:  cesarean section- twin gestation, malpresentation, active labor  POST-OPERATIVE DIAGNOSIS:  cesarean section- twin gestation, malpresentation, active labor  PROCEDURE:  Procedure(s): CESAREAN SECTION (N/A)  SURGEON:  Surgeon(s) and Role:    * [name], MD - Primary  ASSISTANTS: Aura Camps, MD - Ob Fellow   ANESTHESIA:   general  EBL:  Total I/O In: 2536 [I.V.:3300; IV Piggyback:250] Out: 6440 [Urine:475; Blood:1046]  BLOOD ADMINISTERED:none  DRAINS: none   LOCAL MEDICATIONS USED:  NONE  SPECIMEN:  Source of Specimen:  placenta  DISPOSITION OF SPECIMEN:  PATHOLOGY   Procedure Details   The patient was seen in the Holding Room. The risks, benefits, complications, treatment options, and expected outcomes were discussed with the patient.  The patient concurred with the proposed plan, giving informed consent.  The site of surgery properly noted/marked. The patient was taken to Operating Room # C, identified as Valerie Shaw and the procedure verified as C-Section Delivery. A Time Out was held and the above information confirmed.  After induction of anesthesia, the patient was draped and prepped in the usual sterile manner. A Pfannenstiel incision was made and carried down through the subcutaneous tissue to the fascia. Fascial incision was made and extended transversely. The fascia was separated from the underlying rectus tissue superiorly and inferiorly. The peritoneum was identified and entered. Peritoneal incision was extended longitudinally. A low transverse uterine incision was made. Delivered from complete breech presentation was a 1620 gram Female with Apgar scores of 4 at one minute and 8 at five minutes. After the umbilical cord was clamped and cut cord blood was obtained for evaluation. Delivered from breech to cephalic presentation  was a 2130 gram Female with Apgar scores of 8 at one minute and 9 at five minutes.   After the umbilical cord was clamped and cut cord blood was obtained for evaluation. The placenta was removed intact and appeared normal. The uterine outline, tubes and ovaries appeared normal. The uterine incision was closed with running locked sutures of Chromic. A second layer of running locked sutures of chromic was placed.  Hemostasis was observed. Lavage was carried out until clear. The peritoneum was reapproximated with running sutures of 2.0- vicryl.  The muscle was reapproximated with running sutures of chromic.  The fascia was then reapproximated with running sutures of 0- Vicryl. The skin was reapproximated with 4.0-Vicryl.  Instrument, sponge, and needle counts were correct prior the abdominal closure and at the conclusion of the case.   Complications:  Blood loss> 1,083ml  COUNTS:  YES  PLAN OF CARE: Transfer to postpartum  PATIENT DISPOSITION:  PACU - hemodynamically stable.   Delay start of Pharmacological VTE agent (>24hrs) due to surgical blood loss or risk of bleeding: no             Disposition: PACU - hemodynamically stable.         Condition: stable   Aura Camps, MD 05/31/2018 8:49 AM

## 2018-06-01 ENCOUNTER — Encounter (HOSPITAL_COMMUNITY): Payer: Self-pay

## 2018-06-01 ENCOUNTER — Ambulatory Visit (HOSPITAL_COMMUNITY): Payer: 59

## 2018-06-01 DIAGNOSIS — O9081 Anemia of the puerperium: Secondary | ICD-10-CM | POA: Diagnosis not present

## 2018-06-01 LAB — CBC
HCT: 23 % — ABNORMAL LOW (ref 36.0–46.0)
Hemoglobin: 7.4 g/dL — ABNORMAL LOW (ref 12.0–15.0)
MCH: 29.6 pg (ref 26.0–34.0)
MCHC: 32.2 g/dL (ref 30.0–36.0)
MCV: 92 fL (ref 80.0–100.0)
Platelets: 177 10*3/uL (ref 150–400)
RBC: 2.5 MIL/uL — ABNORMAL LOW (ref 3.87–5.11)
RDW: 14.5 % (ref 11.5–15.5)
WBC: 11.5 10*3/uL — ABNORMAL HIGH (ref 4.0–10.5)
nRBC: 0 % (ref 0.0–0.2)

## 2018-06-01 LAB — RPR: RPR Ser Ql: NONREACTIVE

## 2018-06-01 MED ORDER — SODIUM CHLORIDE 0.9 % IV SOLN
510.0000 mg | Freq: Once | INTRAVENOUS | Status: AC
  Administered 2018-06-01: 510 mg via INTRAVENOUS
  Filled 2018-06-01: qty 17

## 2018-06-01 NOTE — Discharge Summary (Signed)
Physician Discharge Summary  Patient ID: Valerie Shaw MRN: [id number] DOB/AGE: 34/02/1985 34 y.o.  Admit date: 05/21/2018 Discharge date: 06/02/2018  Discharge Diagnoses:  Principal Problem:   Preterm premature rupture of membranes (PPROM) delivered, current hospitalization Active Problems:   BMI 39.0-39.9,adult   Supervision of high risk pregnancy, antepartum   Monochorionic diamniotic twin gestation   Late prenatal care   H/O unilateral oophorectomy   Mucinous cystadenoma   S/P cesarean section   Postpartum anemia   Reason for Admission: rupture of membranes Prenatal Procedures: NST and ultrasound Intrapartum Procedures: repeat low transverse c-section Postpartum Procedures: IV iron tranfusion Complications-Operative and Postpartum: none    Hospital Course: Please see HPI dated 05/21/2018 for details. This is a 33 y.o. [id number] now POD#2 from a repeat c-section for breech Twin A in labor with h/o PPROM. Her antepartum course was complicated by h/o c-section, late prenatal care, mono-di twins, PPROM of Twin A at [redacted]w[redacted]d with subsequent pre-term labor at 30 weeks. She underwent a repeat c-section, post op H/H down to 7.4/23 and she received IV iron transfusion.  Her post partum course was uncomplicated. By day 2, she was ambulating, passing flatus and pain was well controlled. She was discharged home POD#2 in good condition. Babies remain in NICU and are doing well.   Instructions for follow up given, she will follow up in 2  weeks for incision check.   Physical exam  Vitals:   06/01/18 1540 06/01/18 1615 06/01/18 2332 06/02/18 0748  BP: (!) 86/44 107/63 (!) 105/57 (!) 124/49  Pulse: 63 66 83 76  Resp: 18  14 18   Temp: 97.9 F (36.6 C)  98.1 F (36.7 C) 98 F (36.7 C)  TempSrc: Oral  Oral Oral  SpO2: 96%  100% 100%  Weight:      Height:       Physical Exam:  General: alert, oriented, cooperative Chest: normal respiratory effort Heart: RRR  Abdomen: soft,  appropriately tender to palpation, incision clean/dry/intact Uterine Fundus: firm, 2 fingers below the umbilicus Lochia: moderate, rubra DVT Evaluation: no evidence of DVT Extremities: no edema, no calf tenderness   Postpartum contraception: Condoms  Disposition: home  Discharged Condition: good  Discharge Instructions    Ambulatory referral to Lactation   Complete by:  As directed    Reason for consult:  The Mother-Infant Dyad Needs Assistance in the Continuation of Breastfeeding   Call MD for:  difficulty breathing, headache or visual disturbances   Complete by:  As directed    Call MD for:  extreme fatigue   Complete by:  As directed    Call MD for:  hives   Complete by:  As directed    Call MD for:  persistant dizziness or light-headedness   Complete by:  As directed    Call MD for:  persistant nausea and vomiting   Complete by:  As directed    Call MD for:  redness, tenderness, or signs of infection (pain, swelling, redness, odor or green/yellow discharge around incision site)   Complete by:  As directed    Call MD for:  severe uncontrolled pain   Complete by:  As directed    Call MD for:  temperature >100.4   Complete by:  As directed    Diet - low sodium heart healthy   Complete by:  As directed      Allergies as of 06/02/2018   No Active Allergies     Medication List    STOP  taking these medications   aspirin EC 81 MG tablet   ondansetron 8 MG tablet Commonly known as:  ZOFRAN   PNV PO   polyethylene glycol 17 g packet Commonly known as:  MIRALAX / GLYCOLAX     TAKE these medications   calcium carbonate 500 MG chewable tablet Commonly known as:  TUMS - dosed in mg elemental calcium Chew 2 tablets by mouth daily as needed for indigestion or heartburn.   docusate sodium 100 MG capsule Commonly known as:  COLACE Take 1 capsule (100 mg total) by mouth 2 (two) times daily as needed.   ibuprofen 600 MG tablet Commonly known as:  ADVIL Take 1 tablet  (600 mg total) by mouth every 6 (six) hours as needed for mild pain, moderate pain or cramping.   oxyCODONE 5 MG immediate release tablet Commonly known as:  Oxy IR/ROXICODONE Take 1 tablet (5 mg total) by mouth every 4 (four) hours as needed for moderate pain.   PRENATAL+DHA PO Take 1 each by mouth daily.      Follow-up [place] [facility] at [place]. Schedule an appointment as soon as possible for a visit.   Specialty:  Obstetrics and Gynecology Why:  Call to make appointment if you have not heard from office by Thursday. Contact information: [place], [place] [phone]          Total face-to-face time with patient: 30 minutes. Over 50% of encounter was spent on counseling and coordination of care.  Signed: Sloan Leiter 06/02/2018, 11:05 AM

## 2018-06-01 NOTE — Progress Notes (Signed)
Faculty Attending Note  Post Op Day 1  Subjective: Patient is feeling okay. She reports moderately well controlled pain on PO pain meds. She is ambulating and reports mild light-headedness with sitting in bed but has been fine ambulating. She is voiding spontaneously. She is not passing flatus, has not had a BM yet. She is tolerating a regular diet without nausea/vomiting. Bleeding is moderate. She is breast & bottle feeding. Babies are in NICU and doing well.  Objective: Blood pressure (!) 95/54, pulse 69, temperature 97.6 F (36.4 C), temperature source Oral, resp. rate 18, height 5\' 4"  (1.626 m), weight 106.6 kg, SpO2 99 %, unknown if currently breastfeeding. Temp:  [97.6 F (36.4 C)-98.7 F (37.1 C)] 97.6 F (36.4 C) (04/20 0820) Pulse Rate:  [62-79] 69 (04/20 0820) Resp:  [16-18] 18 (04/20 0820) BP: (94-137)/(54-76) 95/54 (04/20 0820) SpO2:  [98 %-100 %] 99 % (04/20 0820)  Physical Exam:  General: alert, oriented, cooperative Chest: normal respiratory effort Heart: RRR  Abdomen: soft, appropriately tender to palpation, incision covered by dressing with no evidence of active bleeding  Uterine Fundus: firm, 1 finger below the umbilicus Lochia: moderate, rubra DVT Evaluation: no evidence of DVT Extremities: no edema, no calf tenderness  UOP: voiding spontaneously   Recent Labs    05/31/18 0625 06/01/18 0507  HGB 11.8* 7.4*  HCT 34.9* 23.0*    Assessment/Plan: Patient Active Problem List   Diagnosis Date Noted  . Postpartum anemia 06/01/2018  . S/P cesarean section 05/31/2018  . Monochorionic diamniotic twin gestation 05/22/2018  . Late prenatal care 05/22/2018  . H/O unilateral oophorectomy 05/22/2018  . Mucinous cystadenoma   . Preterm premature rupture of membranes (PPROM) delivered, current hospitalization 05/21/2018  . Supervision of high risk pregnancy, antepartum 02/23/2018  . BMI 39.0-39.9,adult 12/16/2016    Patient is 33 y.o. [id number] POD#1 s/p stat RCS  at [redacted]w[redacted]d for active labor with breech presentation of Twin A in the setting of PPROM of Twin A. Course complicated by PPROM, for which she was admitted at [redacted]w[redacted]d. She is doing very well, recovering appropriately and complains only of appropriate pain and occasional light-headedness. With H/H down to 7.4/23 this am, recommend IV iron transfusion, she is agreeable.   IV iron transfusion today Continue routine post partum care Pain meds prn Regular diet Lovenox 50 mg daily [place] undecided for birth control Plan for discharge likely tomorrow    Valerie Shaw, M.D. Attending [facility] (Faculty Practice)  06/01/2018, 10:34 AM

## 2018-06-02 LAB — CBC
HCT: 21.9 % — ABNORMAL LOW (ref 36.0–46.0)
Hemoglobin: 7.3 g/dL — ABNORMAL LOW (ref 12.0–15.0)
MCH: 30.7 pg (ref 26.0–34.0)
MCHC: 33.3 g/dL (ref 30.0–36.0)
MCV: 92 fL (ref 80.0–100.0)
Platelets: 181 10*3/uL (ref 150–400)
RBC: 2.38 MIL/uL — ABNORMAL LOW (ref 3.87–5.11)
RDW: 14.7 % (ref 11.5–15.5)
WBC: 10.2 10*3/uL (ref 4.0–10.5)
nRBC: 0 % (ref 0.0–0.2)

## 2018-06-02 MED ORDER — IBUPROFEN 600 MG PO TABS: 600.0000 mg | ORAL_TABLET | Freq: Four times a day (QID) | ORAL | 1 refills | Status: DC | PRN

## 2018-06-02 MED ORDER — OXYCODONE HCL 5 MG PO TABS: 5.0000 mg | ORAL_TABLET | ORAL | 0 refills | Status: DC | PRN

## 2018-06-02 MED ORDER — DOCUSATE SODIUM 100 MG PO CAPS: 100.0000 mg | ORAL_CAPSULE | Freq: Two times a day (BID) | ORAL | 2 refills | Status: DC | PRN

## 2018-06-02 NOTE — Progress Notes (Signed)
Patient discharged home with husband. Discharge teaching, home care, pain management, reasons to seek care, follow-up appts, and prescriptions discussed with pt and husband. Pt verbalized understanding.

## 2018-06-02 NOTE — Discharge Instructions (Signed)

## 2018-06-02 NOTE — Lactation Note (Signed)
This note was copied from a baby's chart. Lactation Consultation Note  Patient Name: Valerie Shaw [id number] Date: 06/02/2018   Twins 15 hours old in NICU.  33 weeks.  Hx of PCOS, infertility. Mother asking questions about pumping and concerned about her milk supply.  Mother has been pumping q 1.5-4 hours and have viewed drops. Encouraged mother and praised her for her efforts. Reviewed hand expression bilaterally with drops expressed. Referred mother to hands on pumping video and demonstrated. Checked flange size #24.  Discussed pumping q 2.5 - 3 hours with hand expression before and after pumping.  Encouraged mother to pump after STS and while looking at infants or video/photo. Mother is getting personal DEBP today and will leave other pump parts in infant's room in NICU to pump while visiting. Reviewed settings and suggest mother call for further questions.     Maternal Data    Feeding Feeding Type: Donor Breast Milk  LATCH Score                   Interventions    Lactation Tools Discussed/Used     Consult Status      Carlye Grippe 06/02/2018, 8:33 AM

## 2018-06-02 NOTE — Plan of Care (Signed)
Discharge instructions given

## 2018-06-04 ENCOUNTER — Telehealth: Payer: Self-pay | Admitting: *Deleted

## 2018-06-04 NOTE — Telephone Encounter (Signed)
Pt is S/P delivery of PT twins on 05/31/18.  She states that today her feet are so swollen but denies headaches or visual changes.  I had her to take her BP and she reported 116/83.  I explained that she probably has fluid overload from her IV's and it has now settled into her feet.  I let her know that this is normal occurrence and should dissipate within 1 week to 10 days.  She has agreed to take her BP daily for 1 week and PIH symptoms explained as she can develop that post delivery.  She is instructed to call if she develops  H/A's that wont go away, visual changes or BP's that are 150/90 or greater.

## 2018-06-10 ENCOUNTER — Ambulatory Visit: Payer: Self-pay

## 2018-06-10 ENCOUNTER — Other Ambulatory Visit: Payer: Self-pay

## 2018-06-10 ENCOUNTER — Ambulatory Visit (INDEPENDENT_AMBULATORY_CARE_PROVIDER_SITE_OTHER): Payer: 59 | Admitting: *Deleted

## 2018-06-10 DIAGNOSIS — Z9889 Other specified postprocedural states: Secondary | ICD-10-CM

## 2018-06-10 DIAGNOSIS — Z98891 History of uterine scar from previous surgery: Secondary | ICD-10-CM

## 2018-06-10 NOTE — Lactation Note (Signed)
This note was copied from a baby's chart. Lactation Consultation Note  Patient Name: Valerie Shaw [id number] Date: 06/10/2018 Reason for consult: Follow-up assessment;NICU baby;Multiple gestation;Late-preterm 34-36.6wks Type of Endocrine Disorder?: PCOS History of infertility  Spoke with Mom regarding increasing her milk supply.  Babies are 60 days old, and gaining weight well, taking OG feeds.  Mom has been double pumping (Medela Pump in Style) 8-10 times each day.  Mom does sleep 6-8 hrs a night.  Expressing 15-30 ml each pumping.  Mom taking Fenugreek, Lactation cookies, and drinking blue Gatorade, as some tips she has heard of.  Tips- 1- Pump once at night (2-4 am) as prolactin hormone highest at night 2- Use Symphony DEBP when in NICU with babies.  Symphony is the stronger pump. 3- Moringa supplement (Mom to research this) 4- STS often  Encouraged Mom to ask for assistance when babies are able to latch to breast as an SNS at the breast may help milk supply.     Consult Status Consult Status: Follow-up Date: 06/10/18 Follow-up type: Call as needed    Broadus John 06/10/2018, 12:34 PM

## 2018-06-10 NOTE — Progress Notes (Signed)
Pt here for incision check 10 days post C/S.  The pannus is sticking together.  I have cleansed the sticky and tape for the incision and pannus.  Most of the steri strips have come off.  Area is clean and without drainage.  Area has no redness.  Steri strips reapplied to a couple of area for reinforcement.  Reviewed incision care.  After shower dry incision with hair dryer on low and place a folded 4x4 to keep moisture down.  Watch for signs of infection, ie: redness fever foul discharge. Pt voices understanding

## 2018-06-29 ENCOUNTER — Ambulatory Visit (INDEPENDENT_AMBULATORY_CARE_PROVIDER_SITE_OTHER): Payer: 59 | Admitting: Obstetrics & Gynecology

## 2018-06-29 ENCOUNTER — Encounter: Payer: Self-pay | Admitting: Obstetrics & Gynecology

## 2018-06-29 ENCOUNTER — Other Ambulatory Visit: Payer: Self-pay

## 2018-06-29 VITALS — BP 123/85 | HR 79 | Ht 66.0 in | Wt 213.0 lb

## 2018-06-29 DIAGNOSIS — Z98891 History of uterine scar from previous surgery: Secondary | ICD-10-CM

## 2018-06-29 NOTE — Patient Instructions (Signed)

## 2018-06-29 NOTE — Progress Notes (Signed)
Post Partum Exam  Valerie Shaw is a 33 y.o. G49P1103 female who presents for a postpartum visit. She is 4 weeks postpartum following a low cervical transverse Cesarean section. I have fully reviewed the prenatal and intrapartum course. The delivery was at 33 gestational weeks.  Anesthesia: epidural. Postpartum course has been unremarkable. Babies course has been *remarkable with being preemies.  They are still in NICU. Baby is feeding by both breast and bottle - Similac Neosure. Bleeding clots clots . Bowel function is normal. Bladder function is normal. Patient is not sexually active. Contraception method is coitus interruptus. Postpartum depression screening:neg  The following portions of the patient's history were reviewed and updated as appropriate: allergies, current medications, past family history, past medical history, past social history, past surgical history and problem list. Last pap smear done 07/2015 and was Normal  Review of Systems Pertinent items noted in HPI and remainder of comprehensive ROS otherwise negative.    Objective:  Blood pressure 123/85, pulse 79, height 5\' 6"  (1.676 m), weight 213 lb (96.6 kg), currently breastfeeding.  General:  alert, cooperative and no distress   Breasts:  negative  Lungs: clear to auscultation bilaterally  Heart:  regular rate and rhythm  Abdomen: soft, non-tender; bowel sounds normal; no masses,  no organomegaly   Vulva:  not evaluated  Vagina: not evaluated  Cervix:  not evaluated  Corpus: not examined  Adnexa:  not evaluated  Rectal Exam: Not performed.        Assessment:    Normal postpartum exam.  Removed small piece of vicryl from left aspect of subcutaneous stitch.  Plan:   1. Contraception: coitus interruptus 2. Pt will consider Liletta.  She will call the office and schedule an appt if she chooses this method.  Pt advised about birth spacing after c section (18 months)

## 2018-07-27 ENCOUNTER — Ambulatory Visit: Payer: 59 | Admitting: Obstetrics & Gynecology

## 2018-07-27 ENCOUNTER — Encounter: Payer: Self-pay | Admitting: Obstetrics & Gynecology

## 2018-07-27 ENCOUNTER — Other Ambulatory Visit: Payer: Self-pay

## 2018-07-27 VITALS — BP 114/76 | HR 74 | Ht 65.0 in | Wt 220.0 lb

## 2018-07-27 MED ORDER — LEVONORGESTREL 19.5 MCG/DAY IU IUD: INTRAUTERINE_SYSTEM | Freq: Once | INTRAUTERINE | Status: DC

## 2018-08-10 ENCOUNTER — Encounter: Payer: Self-pay | Admitting: Obstetrics & Gynecology

## 2018-08-10 ENCOUNTER — Other Ambulatory Visit: Payer: Self-pay

## 2018-08-10 ENCOUNTER — Ambulatory Visit (INDEPENDENT_AMBULATORY_CARE_PROVIDER_SITE_OTHER): Payer: 59 | Admitting: Obstetrics & Gynecology

## 2018-08-10 VITALS — BP 125/80 | HR 59 | Resp 16 | Ht 66.0 in | Wt 225.0 lb

## 2018-08-10 DIAGNOSIS — F419 Anxiety disorder, unspecified: Secondary | ICD-10-CM | POA: Diagnosis not present

## 2018-08-10 DIAGNOSIS — Z3202 Encounter for pregnancy test, result negative: Secondary | ICD-10-CM

## 2018-08-10 DIAGNOSIS — Z30430 Encounter for insertion of intrauterine contraceptive device: Secondary | ICD-10-CM

## 2018-08-10 LAB — POCT URINE PREGNANCY: Preg Test, Ur: NEGATIVE

## 2018-08-10 MED ORDER — SERTRALINE HCL 50 MG PO TABS: 50.0000 mg | ORAL_TABLET | Freq: Every day | ORAL | 5 refills | Status: DC

## 2018-08-10 MED ORDER — LEVONORGESTREL 19.5 MCG/DAY IU IUD
INTRAUTERINE_SYSTEM | Freq: Once | INTRAUTERINE | Status: AC
  Administered 2018-08-10: 14:00:00 via INTRAUTERINE

## 2018-08-10 NOTE — Progress Notes (Signed)
   Subjective:    Patient ID: Valerie Shaw, female    DOB: 22-Dec-1985, 33 y.o.   MRN: [id number]  HPI  33 year old female presents for IUD insertion as well as complaints of anxiety and depression.  Her GAD score is 18 and her PHQ 9 is 11.  Patient has never had a problem with anxiety depression.  She states that she had some suicidal thoughts this weekend.  She was able to speak with her husband.  She did not have a plan.  She is not suicidal currently.  She feels very overwhelmed.  She is having problems concentrating.  Patient open to starting medication and counseling.  Review of Systems  Constitutional: Positive for fatigue.  Respiratory: Negative.   Cardiovascular: Negative.   Gastrointestinal: Negative.   Genitourinary: Negative.   Psychiatric/Behavioral: Positive for decreased concentration and dysphoric mood. Negative for self-injury. The patient is nervous/anxious.        Objective:   Physical Exam Vitals signs reviewed.  Constitutional:      General: She is not in acute distress.    Appearance: She is well-developed.  HENT:     Head: Normocephalic and atraumatic.  Eyes:     Conjunctiva/sclera: Conjunctivae normal.  Cardiovascular:     Rate and Rhythm: Normal rate.  Pulmonary:     Effort: Pulmonary effort is normal.  Abdominal:     General: Bowel sounds are normal. There is no distension.     Palpations: Abdomen is soft. There is no mass.     Tenderness: There is no abdominal tenderness. There is no guarding or rebound.  Genitourinary:    General: Normal vulva.     Vagina: No vaginal discharge.  Skin:    General: Skin is warm and dry.  Neurological:     Mental Status: She is alert and oriented to person, place, and time.  Psychiatric:        Mood and Affect: Mood normal.        Behavior: Behavior normal.       Assessment & Plan:  33 year old female with postpartum depression and anxiety after the birth of her twins.  They are 61 weeks old.  Patient also  here for IUD insertion.  1.  Patient is going to check mental health benefits and schedule an appointment with counselor.  We will start her on Zoloft 50 mg a day.  Patient understands if she has suicidal ideation she is to call the office immediately.  The office is close this is an emergency and she should seek help in the emergency room.  Patient does not feel suicidal or have homicidal ideation currently. 2.  IUD insertion 3.  Test TSH 4.  RTC 6 weeks  IUD Procedure Note Patient identified, informed consent performed.  Discussed risks of irregular bleeding, cramping, infection, malpositioning or misplacement of the IUD outside the uterus which may require further procedures. Time out was performed.  Urine pregnancy test negative.  Speculum placed in the vagina.  Cervix visualized.  Cleaned with Betadine x 2.  Grasped anteriorly with a single tooth tenaculum.  Uterus sounded to 8 cm.  Liletta IUD placed per manufacturer's recommendations.  Strings trimmed to 3 cm. Tenaculum was removed, good hemostasis noted.  Patient tolerated procedure well.   Patient was given post-procedure instructions and the Mirena care card with expiration date.  Patient was also asked to check IUD strings periodically and follow up in 4-6 weeks for IUD check.

## 2018-09-28 ENCOUNTER — Ambulatory Visit: Payer: 59 | Admitting: Obstetrics & Gynecology

## 2018-11-17 ENCOUNTER — Ambulatory Visit (INDEPENDENT_AMBULATORY_CARE_PROVIDER_SITE_OTHER): Payer: 59 | Admitting: Family Medicine

## 2018-11-17 DIAGNOSIS — Z23 Encounter for immunization: Secondary | ICD-10-CM

## 2018-11-19 ENCOUNTER — Ambulatory Visit: Payer: 59

## 2019-01-15 ENCOUNTER — Telehealth: Payer: Self-pay | Admitting: Family Medicine

## 2019-01-18 ENCOUNTER — Other Ambulatory Visit: Payer: Self-pay

## 2019-01-18 ENCOUNTER — Encounter: Payer: Self-pay | Admitting: Family Medicine

## 2019-01-18 ENCOUNTER — Ambulatory Visit (INDEPENDENT_AMBULATORY_CARE_PROVIDER_SITE_OTHER): Payer: 59 | Admitting: Family Medicine

## 2019-01-18 VITALS — BP 136/81 | HR 68 | Ht 66.0 in | Wt 250.0 lb

## 2019-01-18 DIAGNOSIS — E282 Polycystic ovarian syndrome: Secondary | ICD-10-CM

## 2019-01-18 DIAGNOSIS — R1031 Right lower quadrant pain: Secondary | ICD-10-CM | POA: Diagnosis not present

## 2019-01-18 DIAGNOSIS — Z0000 Encounter for general adult medical examination without abnormal findings: Secondary | ICD-10-CM

## 2019-01-18 DIAGNOSIS — M79641 Pain in right hand: Secondary | ICD-10-CM

## 2019-01-18 NOTE — Progress Notes (Signed)
Subjective:     Valerie Shaw is a 33 y.o. female and is here for a comprehensive physical exam. The patient reports problems - with intermittant RLQ pain.  Prior history of a very large mucinous cystadenoma on her right ovary.  She says that ever since having it out she has been getting some intermittent right lower quadrant pain.  She still thinks it is related to cysts on her ovaries because she says that when she had the cystadenoma on the right she does have pain on the left and so now she is having pain on the right she is wondering if it is actually coming from her left ovary.  She says it usually would just be a sharp very intense pain but it is brief and then it will go away so it does not usually stop her from doing anything.  She also currently has an IUD in and wants to make [id number] certain sure that she does not get pregnant again.  She has a Ambulance person and.  She also has some pain on the dorsum of her right hand.  She says while at the beach back in the beginning of October, approximately 8 weeks ago, something fell and hit the back of her hand.  She says it was really tender and sensitive for days.  Now it is just tender if she reaches into get something out of her pocket or something just bumps against it then it will be very intense when it happens.  But otherwise she is able to do her normal routine.  He is not currently exercising.  Wants to know what she might be able to get back on for her PCOS.  She did not tolerate Metformin very well.  Again she is most interested in just reducing the cyst formation.   Social History   Socioeconomic History  . Marital status: Married    Spouse name: Not on file  . Number of children: Not on file  . Years of education: Not on file  . Highest education level: Not on file  Occupational History  . Not on file  Social Needs  . Financial resource strain: Not on file  . Food insecurity    Worry: Not on file    Inability: Not on file  .  Transportation needs    Medical: Not on file    Non-medical: Not on file  Tobacco Use  . Smoking status: Former Smoker    Packs/day: 0.50    Types: Cigarettes    Quit date: 02/17/2018    Years since quitting: 0.9  . Smokeless tobacco: Never Used  . Tobacco comment: none since she found out she was pregnant  Substance and Sexual Activity  . Alcohol use: Not Currently    Comment: 2 / month  . Drug use: No  . Sexual activity: Yes    Birth control/protection: Coitus interruptus  Lifestyle  . Physical activity    Days per week: Not on file    Minutes per session: Not on file  . Stress: Not on file  Relationships  . Social Herbalist on phone: Not on file    Gets together: Not on file    Attends religious service: Not on file    Active member of club or organization: Not on file    Attends meetings of clubs or organizations: Not on file    Relationship status: Not on file  . Intimate partner violence    Fear of  current or ex partner: Not on file    Emotionally abused: Not on file    Physically abused: Not on file    Forced sexual activity: Not on file  Other Topics Concern  . Not on file  Social History Narrative   Exercising some.    Health Maintenance  Topic Date Due  . PAP SMEAR-Modifier  07/12/2020  . TETANUS/TDAP  04/26/2028  . INFLUENZA VACCINE  Completed  . HIV Screening  Completed    The following portions of the patient's history were reviewed and updated as appropriate: allergies, current medications, past family history, past medical history, past social history, past surgical history and problem list.  Review of Systems A comprehensive review of systems was negative.   Objective:    BP 136/81   Pulse 68   Ht 5\' 6"  (1.676 m)   Wt 250 lb (113.4 kg)   LMP 12/06/2018 (Approximate)   SpO2 100%   Breastfeeding No   BMI 40.35 kg/m  General appearance: alert, cooperative and appears stated age Head: Normocephalic, without obvious abnormality,  atraumatic Eyes: conj clear, EOMI, PEERLA Ears: normal TM's and external ear canals both ears Nose: Nares normal. Septum midline. Mucosa normal. No drainage or sinus tenderness. Throat: lips, mucosa, and tongue normal; teeth and gums normal Neck: no adenopathy, no carotid bruit, no JVD, supple, symmetrical, trachea midline and thyroid not enlarged, symmetric, no tenderness/mass/nodules Back: symmetric, no curvature. ROM normal. No CVA tenderness. Lungs: clear to auscultation bilaterally Breasts: normal appearance, no masses or tenderness Heart: regular rate and rhythm, S1, S2 normal, no murmur, click, rub or gallop Abdomen: soft, non-tender; bowel sounds normal; no masses,  no organomegaly Extremities: extremities normal, atraumatic, no cyanosis or edema.   She does have tenderness over the dorsum third metacarpal, Pulses: 2+ and symmetric Skin: Skin color, texture, turgor normal. No rashes or lesions Lymph nodes: Cervical, supraclavicular, and axillary nodes normal. Neurologic: Alert and oriented X 3, normal strength and tone. Normal symmetric reflexes. Normal coordination and gait    Assessment:    Healthy female exam.     Plan:     See After Visit Summary for Counseling Recommendations   Keep up a regular exercise program and make sure you are eating a healthy diet Try to eat 4 servings of dairy a day, or if you are lactose intolerant take a calcium with vitamin D daily.  Your vaccines are up to date.   Right lower quadrant pain-unclear etiology.  She feels like it is likely related to cyst formation in her ovaries.  We did discuss maybe getting some hormone levels today and then taking a look at there is any maybe even considering a low-dose birth control even though she already has a progesterone only IUD in place.  Right hand pain.  She does have tenderness over the dorsum third metacarpal, suspect bone bruise or contusion.  Encouraged her to give it a few more weeks to see if it  just improves on its own.  She has normal range of motion of the hands I do not think it is related to any type of fracture or tendon rupture.

## 2019-01-18 NOTE — Patient Instructions (Signed)
Health Maintenance, Female Adopting a healthy lifestyle and getting preventive care are important in promoting health and wellness. Ask your health care provider about:  The right schedule for you to have regular tests and exams.  Things you can do on your own to prevent diseases and keep yourself healthy. What should I know about diet, weight, and exercise? Eat a healthy diet   Eat a diet that includes plenty of vegetables, fruits, low-fat dairy products, and lean protein.  Do not eat a lot of foods that are high in solid fats, added sugars, or sodium. Maintain a healthy weight Body mass index (BMI) is used to identify weight problems. It estimates body fat based on height and weight. Your health care provider can help determine your BMI and help you achieve or maintain a healthy weight. Get regular exercise Get regular exercise. This is one of the most important things you can do for your health. Most adults should:  Exercise for at least 150 minutes each week. The exercise should increase your heart rate and make you sweat (moderate-intensity exercise).  Do strengthening exercises at least twice a week. This is in addition to the moderate-intensity exercise.  Spend less time sitting. Even light physical activity can be beneficial. Watch cholesterol and blood lipids Have your blood tested for lipids and cholesterol at 34 years of age, then have this test every 5 years. Have your cholesterol levels checked more often if:  Your lipid or cholesterol levels are high.  You are older than 34 years of age.  You are at high risk for heart disease. What should I know about cancer screening? Depending on your health history and family history, you may need to have cancer screening at various ages. This may include screening for:  Breast cancer.  Cervical cancer.  Colorectal cancer.  Skin cancer.  Lung cancer. What should I know about heart disease, diabetes, and high blood  pressure? Blood pressure and heart disease  High blood pressure causes heart disease and increases the risk of stroke. This is more likely to develop in people who have high blood pressure readings, are of African descent, or are overweight.  Have your blood pressure checked: ? Every 3-5 years if you are 18-39 years of age. ? Every year if you are 40 years old or older. Diabetes Have regular diabetes screenings. This checks your fasting blood sugar level. Have the screening done:  Once every three years after age 40 if you are at a normal weight and have a low risk for diabetes.  More often and at a younger age if you are overweight or have a high risk for diabetes. What should I know about preventing infection? Hepatitis B If you have a higher risk for hepatitis B, you should be screened for this virus. Talk with your health care provider to find out if you are at risk for hepatitis B infection. Hepatitis C Testing is recommended for:  Everyone born from 1945 through 1965.  Anyone with known risk factors for hepatitis C. Sexually transmitted infections (STIs)  Get screened for STIs, including gonorrhea and chlamydia, if: ? You are sexually active and are younger than 34 years of age. ? You are older than 34 years of age and your health care provider tells you that you are at risk for this type of infection. ? Your sexual activity has changed since you were last screened, and you are at increased risk for chlamydia or gonorrhea. Ask your health care provider if   you are at risk.  Ask your health care provider about whether you are at high risk for HIV. Your health care provider may recommend a prescription medicine to help prevent HIV infection. If you choose to take medicine to prevent HIV, you should first get tested for HIV. You should then be tested every 3 months for as long as you are taking the medicine. Pregnancy  If you are about to stop having your period (premenopausal) and  you may become pregnant, seek counseling before you get pregnant.  Take 400 to 800 micrograms (mcg) of folic acid every day if you become pregnant.  Ask for birth control (contraception) if you want to prevent pregnancy. Osteoporosis and menopause Osteoporosis is a disease in which the bones lose minerals and strength with aging. This can result in bone fractures. If you are 65 years old or older, or if you are at risk for osteoporosis and fractures, ask your health care provider if you should:  Be screened for bone loss.  Take a calcium or vitamin D supplement to lower your risk of fractures.  Be given hormone replacement therapy (HRT) to treat symptoms of menopause. Follow these instructions at home: Lifestyle  Do not use any products that contain nicotine or tobacco, such as cigarettes, e-cigarettes, and chewing tobacco. If you need help quitting, ask your health care provider.  Do not use street drugs.  Do not share needles.  Ask your health care provider for help if you need support or information about quitting drugs. Alcohol use  Do not drink alcohol if: ? Your health care provider tells you not to drink. ? You are pregnant, may be pregnant, or are planning to become pregnant.  If you drink alcohol: ? Limit how much you use to 0-1 drink a day. ? Limit intake if you are breastfeeding.  Be aware of how much alcohol is in your drink. In the [place] one drink equals one 12 oz bottle of beer (355 mL), one 5 oz glass of wine (148 mL), or one 1 oz glass of hard liquor (44 mL). General instructions  Schedule regular health, dental, and eye exams.  Stay current with your vaccines.  Tell your health care provider if: ? You often feel depressed. ? You have ever been abused or do not feel safe at home. Summary  Adopting a healthy lifestyle and getting preventive care are important in promoting health and wellness.  Follow your health care provider's instructions about healthy  diet, exercising, and getting tested or screened for diseases.  Follow your health care provider's instructions on monitoring your cholesterol and blood pressure. This information is not intended to replace advice given to you by your health care provider. Make sure you discuss any questions you have with your health care provider. Document Released: 08/13/2010 Document Revised: 01/21/2018 Document Reviewed: 01/21/2018 Elsevier Patient Education  2020 Elsevier Inc.  

## 2019-01-19 NOTE — Telephone Encounter (Signed)
eror

## 2019-01-21 LAB — LIPID PANEL W/REFLEX DIRECT LDL
Cholesterol: 203 mg/dL — ABNORMAL HIGH (ref <200)
HDL: 41 mg/dL — ABNORMAL LOW (ref >=50)
LDL Cholesterol (Calc): 138 mg/dL (calc) — ABNORMAL HIGH
Non-HDL Cholesterol (Calc): 162 mg/dL (calc) — ABNORMAL HIGH (ref <130)
Total CHOL/HDL Ratio: 5 (calc) — ABNORMAL HIGH (ref <5.0)
Triglycerides: 121 mg/dL (ref <150)

## 2019-01-21 LAB — COMPLETE METABOLIC PANEL WITH GFR
AG Ratio: 1.6 (calc) (ref 1.0–2.5)
ALT: 9 U/L (ref 6–29)
AST: 15 U/L (ref 10–30)
Albumin: 4.4 g/dL (ref 3.6–5.1)
Alkaline phosphatase (APISO): 68 U/L (ref 31–125)
BUN: 17 mg/dL (ref 7–25)
CO2: 26 mmol/L (ref 20–32)
Calcium: 9.5 mg/dL (ref 8.6–10.2)
Chloride: 104 mmol/L (ref 98–110)
Creat: 0.9 mg/dL (ref 0.50–1.10)
GFR, Est African American: 97 mL/min/{1.73_m2} (ref >=60)
GFR, Est Non African American: 83 mL/min/{1.73_m2} (ref >=60)
Globulin: 2.8 g/dL (calc) (ref 1.9–3.7)
Glucose, Bld: 95 mg/dL (ref 65–99)
Potassium: 4.3 mmol/L (ref 3.5–5.3)
Sodium: 137 mmol/L (ref 135–146)
Total Bilirubin: 0.6 mg/dL (ref 0.2–1.2)
Total Protein: 7.2 g/dL (ref 6.1–8.1)

## 2019-01-21 LAB — CBC
HCT: 43.3 % (ref 35.0–45.0)
Hemoglobin: 14.4 g/dL (ref 11.7–15.5)
MCH: 29.1 pg (ref 27.0–33.0)
MCHC: 33.3 g/dL (ref 32.0–36.0)
MCV: 87.7 fL (ref 80.0–100.0)
MPV: 11.6 fL (ref 7.5–12.5)
Platelets: 239 10*3/uL (ref 140–400)
RBC: 4.94 10*6/uL (ref 3.80–5.10)
RDW: 13.1 % (ref 11.0–15.0)
WBC: 7.6 10*3/uL (ref 3.8–10.8)

## 2019-01-21 LAB — T4, FREE: Free T4: 0.9 ng/dL (ref 0.8–1.8)

## 2019-01-21 LAB — TSH: TSH: 4.21 mIU/L

## 2019-01-21 LAB — LUTEINIZING HORMONE: LH: 2.2 m[IU]/mL

## 2019-01-21 LAB — PROGESTERONE: Progesterone: 2.2 ng/mL

## 2019-01-21 LAB — FOLLICLE STIMULATING HORMONE: FSH: 3.7 m[IU]/mL

## 2019-01-21 LAB — ESTRADIOL: Estradiol: 63 pg/mL

## 2019-02-10 ENCOUNTER — Telehealth: Payer: Self-pay | Admitting: *Deleted

## 2019-02-10 NOTE — Telephone Encounter (Signed)
Form completed,faxed,confirmation received and scanned into patient's chart..Velena Keegan Lynetta, CMA  

## 2019-04-14 ENCOUNTER — Encounter: Payer: Self-pay | Admitting: Family Medicine

## 2019-04-16 MED ORDER — METFORMIN HCL 500 MG PO TABS: 500.0000 mg | ORAL_TABLET | Freq: Two times a day (BID) | ORAL | 3 refills | Status: DC

## 2019-04-16 NOTE — Telephone Encounter (Signed)
I am sending metformin and have her schedule a f/u in 4 weeks. Marcus for virtual or in person to see how doing on the metformin and to address skin lesion.

## 2019-12-21 ENCOUNTER — Encounter: Payer: 59 | Admitting: Family Medicine

## 2020-01-14 ENCOUNTER — Encounter: Payer: 59 | Admitting: Family Medicine

## 2020-01-15 ENCOUNTER — Other Ambulatory Visit: Payer: Self-pay

## 2020-01-15 ENCOUNTER — Emergency Department (INDEPENDENT_AMBULATORY_CARE_PROVIDER_SITE_OTHER)
Admission: EM | Admit: 2020-01-15 | Discharge: 2020-01-15 | Disposition: A | Discharge status: Home / Self Care | Payer: 59 | Source: Home / Self Care

## 2020-01-15 DIAGNOSIS — J209 Acute bronchitis, unspecified: Secondary | ICD-10-CM | POA: Diagnosis not present

## 2020-01-15 DIAGNOSIS — J029 Acute pharyngitis, unspecified: Secondary | ICD-10-CM

## 2020-01-15 MED ORDER — PREDNISONE 20 MG PO TABS: 40.0000 mg | ORAL_TABLET | Freq: Every day | ORAL | 0 refills | Status: AC

## 2020-01-15 MED ORDER — AMOXICILLIN 875 MG PO TABS: 875.0000 mg | ORAL_TABLET | Freq: Two times a day (BID) | ORAL | 0 refills | Status: DC

## 2020-01-15 MED ORDER — PROMETHAZINE-DM 6.25-15 MG/5ML PO SYRP: 5.0000 mL | ORAL_SOLUTION | Freq: Three times a day (TID) | ORAL | 0 refills | Status: DC | PRN

## 2020-01-15 NOTE — ED Triage Notes (Signed)
Patient presents to Urgent Care with complaints of sore throat since 7 days ago. Patient reports during the night her throat is the worst, is coughing now as well and has developed nasal drainage too.  Pt states it feels like she is swallowing glass when she does not have a cough drop in. Pt has not been vaccinated for covid, just got over covid 1 month ago.

## 2020-01-15 NOTE — ED Provider Notes (Signed)
[name]    CSN: [id number] Arrival date & time: 01/15/20  0800      History   Chief Complaint Chief Complaint  Patient presents with  . Sore Throat    HPI DEVON KINGDON is a 34 y.o. female.   HPI  Patient presents with cough, mild shortness of breath, sore throat x 7 days. Patient is s/p COVID-19 infection times one month ago. She had a mild course of illness. Currently denies any sick contacts.  She has had mild congestion. Treated symptoms with multiple OTC medications without relief. Afebrile.  Past Medical History:  Diagnosis Date  . Complication of anesthesia    have a hard time waking her up, felt hot, nausea and vomiting  . Foot fracture    age 32  . Headache   . Infertility, female    clomid and stimulation meds in past; treated for years   . Mucinous cystadenoma   . PCOS (polycystic ovarian syndrome)   . Supervision of high risk pregnancy, antepartum 02/23/2018    Nursing Staff Provider Office Location  [place] Dating   2nd trimester Language   [facility] US   Flu Vaccine  02/23/18  Genetic Screen  declines   TDaP vaccine    Hgb A1C or  GTT Early: 5.1 Third trimester  Rhogam   n/a   LAB RESULTS  Feeding Plan  breast/bottle Blood Type B/RH(D) POSITIVE/-- (01/13 1121) B+ Contraception  unsure Antibody NO ANTIBODIES DETECTED (01/13 1121) Circumcision  [place]  . Weight gain     Patient Active Problem List   Diagnosis Date Noted  . PCOS (polycystic ovarian syndrome) 01/18/2019  . H/O unilateral oophorectomy 05/22/2018  . Mucinous cystadenoma   . BMI 39.0-39.9,adult 12/16/2016    Past Surgical History:  Procedure Laterality Date  . ARTHROSCOPIC REPAIR ACL    . CESAREAN SECTION    . CESAREAN SECTION N/A 05/31/2018   Procedure: CESAREAN SECTION;  Surgeon: [name], MD;  Location: [facility];  Service: Obstetrics;  Laterality: N/A;  . LAPAROSCOPIC BILATERAL SALPINGO OOPHERECTOMY N/A 06/19/2017   Procedure: EXPLORATORY LAPAROTOMY RIGHT  SALPINGO OOPHORECTOMY;  Surgeon: Isabel Caprice, MD;  Location: [facility];  Service: Gynecology;  Laterality: N/A;  . WISDOM TOOTH EXTRACTION      OB History    Gravida  2   Para  2   Term  1   Preterm  1   AB      Living  3     SAB      TAB      Ectopic      Multiple  1   Live Births  3            Home Medications    Prior to Admission medications   Medication Sig Start Date End Date Taking? Authorizing Provider  metFORMIN (GLUCOPHAGE) 500 MG tablet Take 1 tablet (500 mg total) by mouth 2 (two) times daily with a meal. 04/16/19  Yes Hali Marry, MD    Family History Family History  Problem Relation Age of Onset  . Hypothyroidism Mother   . Drug abuse Father   . Hypertension Other   . Cancer Paternal Uncle        unkown primary  . Lung cancer Maternal Grandfather        smoker    Social History Social History   Tobacco Use  . Smoking status: Current Some Day Smoker    Packs/day: 0.50  Types: Cigarettes    Last attempt to quit: 02/17/2018    Years since quitting: 1.9  . Smokeless tobacco: Never Used  . Tobacco comment: none since she found out she was pregnant  Vaping Use  . Vaping Use: Never used  Substance Use Topics  . Alcohol use: Not Currently    Comment: 2 / month  . Drug use: No     Allergies   Patient has no known allergies.  Review of Systems Review of Systems Pertinent negatives listed in HPI Physical Exam Triage Vital Signs ED Triage Vitals  Enc Vitals Group     BP 01/15/20 0808 (!) 150/101     Pulse Rate 01/15/20 0808 86     Resp 01/15/20 0808 16     Temp 01/15/20 0808 98.4 F (36.9 C)     Temp Source 01/15/20 0808 Oral     SpO2 01/15/20 0808 99 %     Weight --      Height --      Head Circumference --      Peak Flow --      Pain Score 01/15/20 0807 2     Pain Loc --      Pain Edu? --      Excl. in [place]? --    No data found.  Updated Vital Signs BP (!) 150/101 (BP Location: Right Arm)   Pulse 86    Temp 98.4 F (36.9 C) (Oral)   Resp 16   SpO2 99%   Visual Acuity Right Eye Distance:   Left Eye Distance:   Bilateral Distance:    Right Eye Near:   Left Eye Near:    Bilateral Near:     Physical Exam Constitutional:      Appearance: She is well-developed.  HENT:     Head: Normocephalic.     Mouth/Throat:     Mouth: Mucous membranes are moist.     Pharynx: Pharyngeal swelling and posterior oropharyngeal erythema present.  Cardiovascular:     Rate and Rhythm: Normal rate and regular rhythm.  Pulmonary:     Effort: Pulmonary effort is normal.     Breath sounds: Examination of the right-upper field reveals rhonchi. Examination of the left-upper field reveals rhonchi. Rhonchi present.     Comments: Coarse lung sounds  Present  Musculoskeletal:     Cervical back: Normal range of motion.  Skin:    General: Skin is warm.     Capillary Refill: Capillary refill takes less than 2 seconds.  Neurological:     Mental Status: She is alert.  Psychiatric:        Mood and Affect: Mood normal.    UC Treatments / Results  Labs (all labs ordered are listed, but only abnormal results are displayed) Labs Reviewed - No data to display  EKG   Radiology No results found.  Procedures Procedures (including critical care time)  Medications Ordered in UC Medications - No data to display  Initial Impression / Assessment and Plan / UC Course  I have reviewed the triage vital signs and the nursing notes.  Pertinent labs & imaging results that were available during my care of the patient were reviewed by me and considered in my medical decision making (see chart for details).     Acute bronchitis and pharyngitis. Treatment per discharge medication below. S/p COVID 30 days ago, suspect this is separate course of illness. Red flag precaution given. Final Clinical Impressions(s) / UC Diagnoses   Final diagnoses:  Acute bronchitis, unspecified organism  Sore throat   Discharge  Instructions   None    ED Prescriptions    Medication Sig Dispense Auth. Provider   predniSONE (DELTASONE) 20 MG tablet Take 2 tablets (40 mg total) by mouth daily with breakfast for 5 days. 10 tablet Scot Jun, FNP   promethazine-dextromethorphan (PROMETHAZINE-DM) 6.25-15 MG/5ML syrup Take 5 mLs by mouth 3 (three) times daily as needed for cough. 120 mL Scot Jun, FNP   amoxicillin (AMOXIL) 875 MG tablet Take 1 tablet (875 mg total) by mouth 2 (two) times daily. 20 tablet Scot Jun, FNP     PDMP not reviewed this encounter.   Scot Jun, FNP 01/20/20 2200

## 2020-01-20 ENCOUNTER — Encounter: Payer: Self-pay | Admitting: Family Medicine

## 2020-01-21 ENCOUNTER — Ambulatory Visit (INDEPENDENT_AMBULATORY_CARE_PROVIDER_SITE_OTHER): Payer: 59 | Admitting: Family Medicine

## 2020-01-21 ENCOUNTER — Other Ambulatory Visit: Payer: Self-pay

## 2020-01-21 ENCOUNTER — Other Ambulatory Visit (HOSPITAL_COMMUNITY)
Admission: RE | Admit: 2020-01-21 | Discharge: 2020-01-21 | Disposition: A | Discharge status: Home / Self Care | Payer: 59 | Source: Ambulatory Visit | Attending: Family Medicine | Admitting: Family Medicine

## 2020-01-21 ENCOUNTER — Encounter: Payer: Self-pay | Admitting: Family Medicine

## 2020-01-21 VITALS — BP 116/68 | HR 80 | Ht 66.0 in | Wt 216.0 lb

## 2020-01-21 DIAGNOSIS — Z124 Encounter for screening for malignant neoplasm of cervix: Secondary | ICD-10-CM

## 2020-01-21 DIAGNOSIS — Z13220 Encounter for screening for lipoid disorders: Secondary | ICD-10-CM | POA: Diagnosis not present

## 2020-01-21 DIAGNOSIS — Z1329 Encounter for screening for other suspected endocrine disorder: Secondary | ICD-10-CM

## 2020-01-21 DIAGNOSIS — Z23 Encounter for immunization: Secondary | ICD-10-CM

## 2020-01-21 DIAGNOSIS — Z0000 Encounter for general adult medical examination without abnormal findings: Secondary | ICD-10-CM | POA: Diagnosis not present

## 2020-01-21 DIAGNOSIS — R1031 Right lower quadrant pain: Secondary | ICD-10-CM

## 2020-01-21 DIAGNOSIS — Z131 Encounter for screening for diabetes mellitus: Secondary | ICD-10-CM

## 2020-01-21 MED ORDER — METFORMIN HCL 500 MG PO TABS: 500.0000 mg | ORAL_TABLET | Freq: Two times a day (BID) | ORAL | 3 refills | Status: DC

## 2020-01-21 NOTE — Progress Notes (Signed)
Subjective:     Valerie Shaw is a 34 y.o. female and is here for a comprehensive physical exam. The patient reports no problems.  She is actually doing really well.  She is been actively working on weight loss and has lost between 35 to 40 pounds.  She has done phenomenally.  She does not have a specific exercise routine but does stay physically active.  She has a history of having her right ovary removed but more recently has been getting some intermittent right lower quadrant pain.  It comes and goes.  It seems to happen about a week or so before her menstrual cycle. When she had a large benign tumor on her right ovary she was having intermittant pain in her left lower quadrant.  She is sexually active with husband.     Social History   Socioeconomic History  . Marital status: Married    Spouse name: Not on file  . Number of children: Not on file  . Years of education: Not on file  . Highest education level: Not on file  Occupational History  . Not on file  Tobacco Use  . Smoking status: Current Some Day Smoker    Packs/day: 0.50    Types: Cigarettes    Last attempt to quit: 02/17/2018    Years since quitting: 1.9  . Smokeless tobacco: Never Used  . Tobacco comment: none since she found out she was pregnant  Vaping Use  . Vaping Use: Never used  Substance and Sexual Activity  . Alcohol use: Not Currently    Comment: 2 / month  . Drug use: No  . Sexual activity: Yes    Birth control/protection: Coitus interruptus  Other Topics Concern  . Not on file  Social History Narrative   Exercising some.    Social Determinants of Health   Financial Resource Strain: Not on file  Food Insecurity: Not on file  Transportation Needs: Not on file  Physical Activity: Not on file  Stress: Not on file  Social Connections: Not on file  Intimate Partner Violence: Not on file   Health Maintenance  Topic Date Due  . Hepatitis C Screening  Never done  . COVID-19 Vaccine (1) Never done   . PAP SMEAR-Modifier  07/12/2020  . TETANUS/TDAP  04/26/2028  . INFLUENZA VACCINE  Completed  . HIV Screening  Completed     The following portions of the patient's history were reviewed and updated as appropriate: allergies, current medications, past family history, past medical history, past social history, past surgical history and problem list. Review of Systems A comprehensive review of systems was negative.   Objective:    BP 116/68   Pulse 80   Ht 5\' 6"  (1.676 m)   Wt 216 lb (98 kg)   SpO2 100%   BMI 34.86 kg/m  General appearance: alert, cooperative and appears stated age Head: Normocephalic, without obvious abnormality, atraumatic Eyes: conj clear, EOMI, PEERLA Ears: normal TM's and external ear canals both ears Nose: Nares normal. Septum midline. Mucosa normal. No drainage or sinus tenderness. Throat: lips, mucosa, and tongue normal; teeth and gums normal Neck: no adenopathy, no carotid bruit, no JVD, supple, symmetrical, trachea midline and thyroid not enlarged, symmetric, no tenderness/mass/nodules Back: symmetric, no curvature. ROM normal. No CVA tenderness. Lungs: clear to auscultation bilaterally Heart: regular rate and rhythm, S1, S2 normal, no murmur, click, rub or gallop Abdomen: soft, non-tender; bowel sounds normal; no masses,  no organomegaly Pelvic: cervix normal in  appearance, external genitalia normal, no adnexal masses or tenderness, no cervical motion tenderness, rectovaginal septum normal, uterus normal size, shape, and consistency and vagina normal without discharge Extremities: extremities normal, atraumatic, no cyanosis or edema Pulses: 2+ and symmetric Skin: Skin color, texture, turgor normal. No rashes or lesions Lymph nodes: Cervical adenopathy: nl and Supraclavicular adenopathy: nl Neurologic: Alert and oriented X 3, normal strength and tone. Normal symmetric reflexes. Normal coordination and gait    Assessment:    Healthy female exam.      Plan:     See After Visit Summary for Counseling Recommendations   complete physical examination Keep up a regular exercise program and make sure you are eating a healthy diet Try to eat 4 servings of dairy a day, or if you are lactose intolerant take a calcium with vitamin D daily.  Your vaccines are up to date.  Flu shot given.   Pap smear performed.   Unable to visualize her IUD string on exam and since she has been having some intermittent right lower quadrant pain will order pelvic ultrasound.

## 2020-01-21 NOTE — Patient Instructions (Signed)
Health Maintenance, Female Adopting a healthy lifestyle and getting preventive care are important in promoting health and wellness. Ask your health care provider about:  The right schedule for you to have regular tests and exams.  Things you can do on your own to prevent diseases and keep yourself healthy. What should I know about diet, weight, and exercise? Eat a healthy diet   Eat a diet that includes plenty of vegetables, fruits, low-fat dairy products, and lean protein.  Do not eat a lot of foods that are high in solid fats, added sugars, or sodium. Maintain a healthy weight Body mass index (BMI) is used to identify weight problems. It estimates body fat based on height and weight. Your health care provider can help determine your BMI and help you achieve or maintain a healthy weight. Get regular exercise Get regular exercise. This is one of the most important things you can do for your health. Most adults should:  Exercise for at least 150 minutes each week. The exercise should increase your heart rate and make you sweat (moderate-intensity exercise).  Do strengthening exercises at least twice a week. This is in addition to the moderate-intensity exercise.  Spend less time sitting. Even light physical activity can be beneficial. Watch cholesterol and blood lipids Have your blood tested for lipids and cholesterol at 35 years of age, then have this test every 5 years. Have your cholesterol levels checked more often if:  Your lipid or cholesterol levels are high.  You are older than 35 years of age.  You are at high risk for heart disease. What should I know about cancer screening? Depending on your health history and family history, you may need to have cancer screening at various ages. This may include screening for:  Breast cancer.  Cervical cancer.  Colorectal cancer.  Skin cancer.  Lung cancer. What should I know about heart disease, diabetes, and high blood  pressure? Blood pressure and heart disease  High blood pressure causes heart disease and increases the risk of stroke. This is more likely to develop in people who have high blood pressure readings, are of African descent, or are overweight.  Have your blood pressure checked: ? Every 3-5 years if you are 18-39 years of age. ? Every year if you are 40 years old or older. Diabetes Have regular diabetes screenings. This checks your fasting blood sugar level. Have the screening done:  Once every three years after age 40 if you are at a normal weight and have a low risk for diabetes.  More often and at a younger age if you are overweight or have a high risk for diabetes. What should I know about preventing infection? Hepatitis B If you have a higher risk for hepatitis B, you should be screened for this virus. Talk with your health care provider to find out if you are at risk for hepatitis B infection. Hepatitis C Testing is recommended for:  Everyone born from 1945 through 1965.  Anyone with known risk factors for hepatitis C. Sexually transmitted infections (STIs)  Get screened for STIs, including gonorrhea and chlamydia, if: ? You are sexually active and are younger than 35 years of age. ? You are older than 35 years of age and your health care provider tells you that you are at risk for this type of infection. ? Your sexual activity has changed since you were last screened, and you are at increased risk for chlamydia or gonorrhea. Ask your health care provider if   you are at risk.  Ask your health care provider about whether you are at high risk for HIV. Your health care provider may recommend a prescription medicine to help prevent HIV infection. If you choose to take medicine to prevent HIV, you should first get tested for HIV. You should then be tested every 3 months for as long as you are taking the medicine. Pregnancy  If you are about to stop having your period (premenopausal) and  you may become pregnant, seek counseling before you get pregnant.  Take 400 to 800 micrograms (mcg) of folic acid every day if you become pregnant.  Ask for birth control (contraception) if you want to prevent pregnancy. Osteoporosis and menopause Osteoporosis is a disease in which the bones lose minerals and strength with aging. This can result in bone fractures. If you are 65 years old or older, or if you are at risk for osteoporosis and fractures, ask your health care provider if you should:  Be screened for bone loss.  Take a calcium or vitamin D supplement to lower your risk of fractures.  Be given hormone replacement therapy (HRT) to treat symptoms of menopause. Follow these instructions at home: Lifestyle  Do not use any products that contain nicotine or tobacco, such as cigarettes, e-cigarettes, and chewing tobacco. If you need help quitting, ask your health care provider.  Do not use street drugs.  Do not share needles.  Ask your health care provider for help if you need support or information about quitting drugs. Alcohol use  Do not drink alcohol if: ? Your health care provider tells you not to drink. ? You are pregnant, may be pregnant, or are planning to become pregnant.  If you drink alcohol: ? Limit how much you use to 0-1 drink a day. ? Limit intake if you are breastfeeding.  Be aware of how much alcohol is in your drink. In the [place] one drink equals one 12 oz bottle of beer (355 mL), one 5 oz glass of wine (148 mL), or one 1 oz glass of hard liquor (44 mL). General instructions  Schedule regular health, dental, and eye exams.  Stay current with your vaccines.  Tell your health care provider if: ? You often feel depressed. ? You have ever been abused or do not feel safe at home. Summary  Adopting a healthy lifestyle and getting preventive care are important in promoting health and wellness.  Follow your health care provider's instructions about healthy  diet, exercising, and getting tested or screened for diseases.  Follow your health care provider's instructions on monitoring your cholesterol and blood pressure. This information is not intended to replace advice given to you by your health care provider. Make sure you discuss any questions you have with your health care provider. Document Revised: 01/21/2018 Document Reviewed: 01/21/2018 Elsevier Patient Education  2020 [employer].  

## 2020-01-22 LAB — PROGESTERONE: Progesterone: 6.6 ng/mL

## 2020-01-22 LAB — COMPLETE METABOLIC PANEL WITH GFR
AG Ratio: 1.6 (calc) (ref 1.0–2.5)
ALT: 11 U/L (ref 6–29)
AST: 13 U/L (ref 10–30)
Albumin: 4.4 g/dL (ref 3.6–5.1)
Alkaline phosphatase (APISO): 76 U/L (ref 31–125)
BUN: 17 mg/dL (ref 7–25)
CO2: 28 mmol/L (ref 20–32)
Calcium: 9.5 mg/dL (ref 8.6–10.2)
Chloride: 101 mmol/L (ref 98–110)
Creat: 0.88 mg/dL (ref 0.50–1.10)
GFR, Est African American: 99 mL/min/{1.73_m2} (ref >=60)
GFR, Est Non African American: 85 mL/min/{1.73_m2} (ref >=60)
Globulin: 2.7 g/dL (calc) (ref 1.9–3.7)
Glucose, Bld: 79 mg/dL (ref 65–99)
Potassium: 4.1 mmol/L (ref 3.5–5.3)
Sodium: 137 mmol/L (ref 135–146)
Total Bilirubin: 0.4 mg/dL (ref 0.2–1.2)
Total Protein: 7.1 g/dL (ref 6.1–8.1)

## 2020-01-22 LAB — CBC WITH DIFFERENTIAL/PLATELET
Absolute Monocytes: 682 cells/uL (ref 200–950)
Basophils Absolute: 25 cells/uL (ref 0–200)
Basophils Relative: 0.2 %
Eosinophils Absolute: 124 cells/uL (ref 15–500)
Eosinophils Relative: 1 %
HCT: 43.1 % (ref 35.0–45.0)
Hemoglobin: 14.5 g/dL (ref 11.7–15.5)
Lymphs Abs: 4228 cells/uL — ABNORMAL HIGH (ref 850–3900)
MCH: 29.6 pg (ref 27.0–33.0)
MCHC: 33.6 g/dL (ref 32.0–36.0)
MCV: 88 fL (ref 80.0–100.0)
MPV: 11.2 fL (ref 7.5–12.5)
Monocytes Relative: 5.5 %
Neutro Abs: 7341 cells/uL (ref 1500–7800)
Neutrophils Relative %: 59.2 %
Platelets: 283 10*3/uL (ref 140–400)
RBC: 4.9 10*6/uL (ref 3.80–5.10)
RDW: 12.2 % (ref 11.0–15.0)
Total Lymphocyte: 34.1 %
WBC: 12.4 10*3/uL — ABNORMAL HIGH (ref 3.8–10.8)

## 2020-01-22 LAB — LIPID PANEL W/REFLEX DIRECT LDL
Cholesterol: 175 mg/dL (ref <200)
HDL: 46 mg/dL — ABNORMAL LOW (ref >=50)
LDL Cholesterol (Calc): 108 mg/dL (calc) — ABNORMAL HIGH
Non-HDL Cholesterol (Calc): 129 mg/dL (calc) (ref <130)
Total CHOL/HDL Ratio: 3.8 (calc) (ref <5.0)
Triglycerides: 117 mg/dL (ref <150)

## 2020-01-22 LAB — ESTRADIOL: Estradiol: 144 pg/mL

## 2020-01-22 LAB — TSH: TSH: 1.19 mIU/L

## 2020-01-22 LAB — LUTEINIZING HORMONE: LH: 5.3 m[IU]/mL

## 2020-01-22 LAB — FOLLICLE STIMULATING HORMONE: FSH: 4.6 m[IU]/mL

## 2020-01-25 ENCOUNTER — Other Ambulatory Visit: Payer: Self-pay

## 2020-01-25 ENCOUNTER — Ambulatory Visit (INDEPENDENT_AMBULATORY_CARE_PROVIDER_SITE_OTHER): Payer: 59

## 2020-01-25 DIAGNOSIS — R1031 Right lower quadrant pain: Secondary | ICD-10-CM | POA: Diagnosis not present

## 2020-01-25 LAB — CYTOLOGY - PAP
Comment: NEGATIVE
Diagnosis: NEGATIVE
High risk HPV: NEGATIVE

## 2020-01-25 NOTE — Progress Notes (Signed)
Call patient: Your Pap smear is normal. Repeat in 5 years.

## 2020-01-26 IMAGING — US US MFM OB FOLLOW UP ADDL GEST
1 series · 12 of 28 positions shown · non-contrast
Comparison: none

[Series 1: us mfm ob follow up addl gest · 12 of 43 slices shown]
[im 2/43]
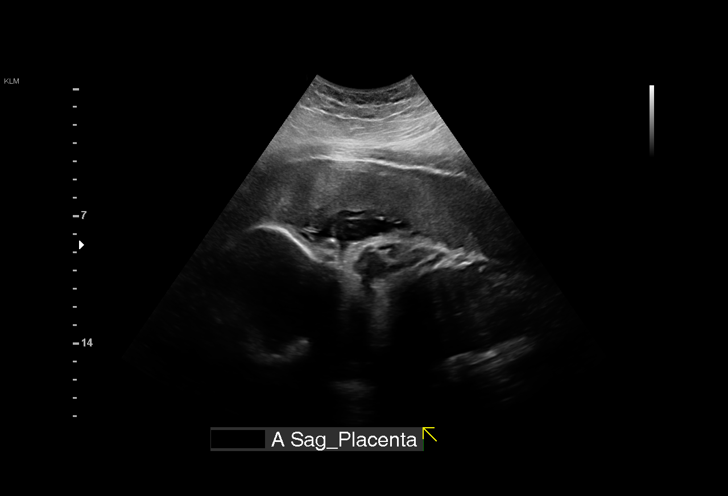
[im 5/43]
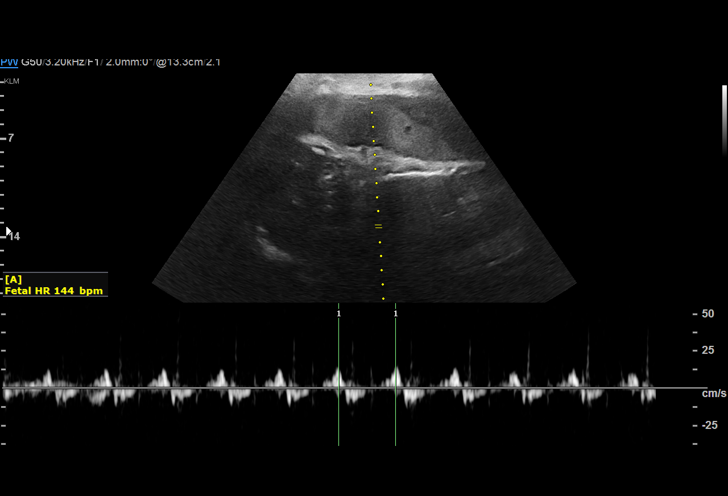
[im 8/43]
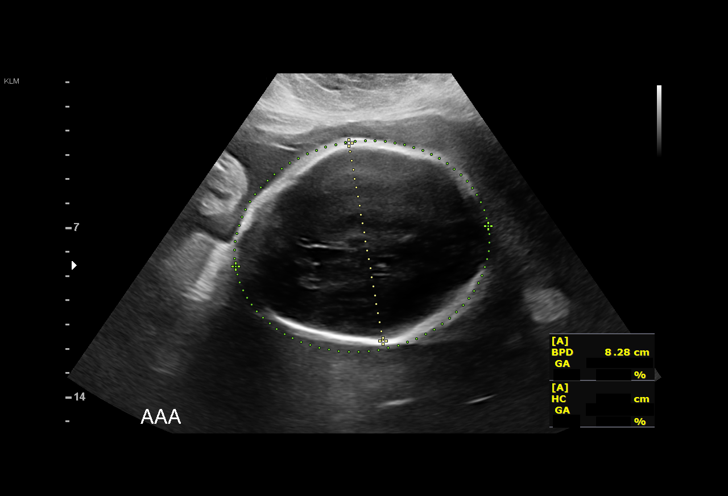
[im 13/43]
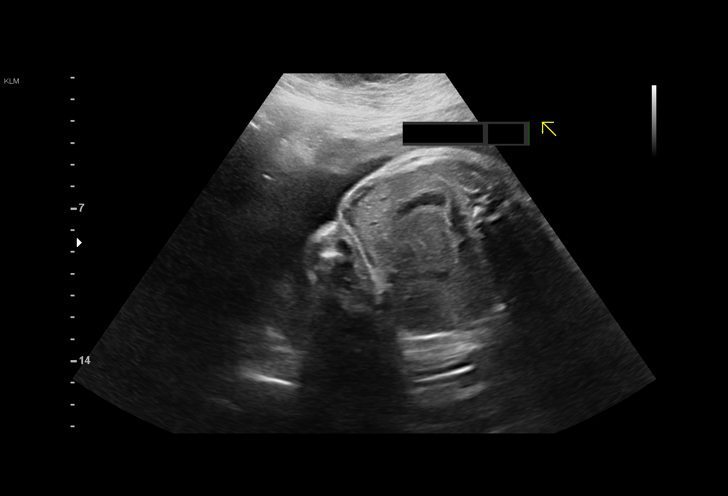
[im 16/43]
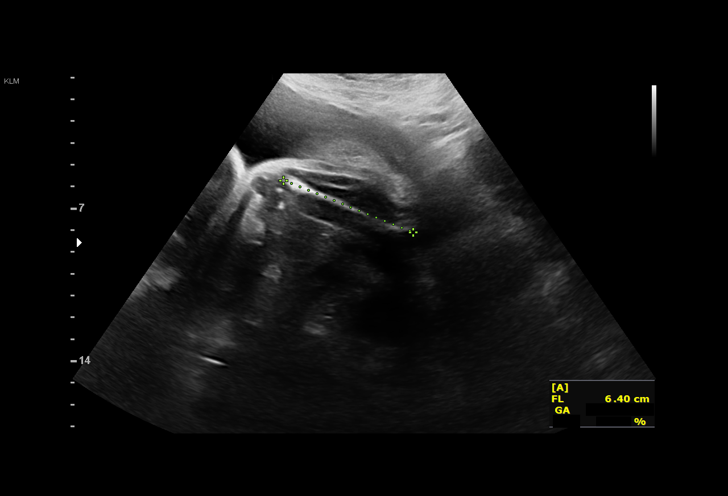
[im 19/43]
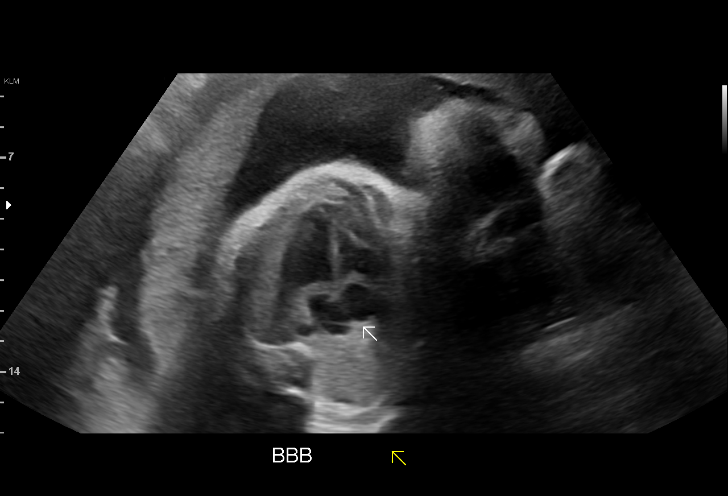
[im 24/43]
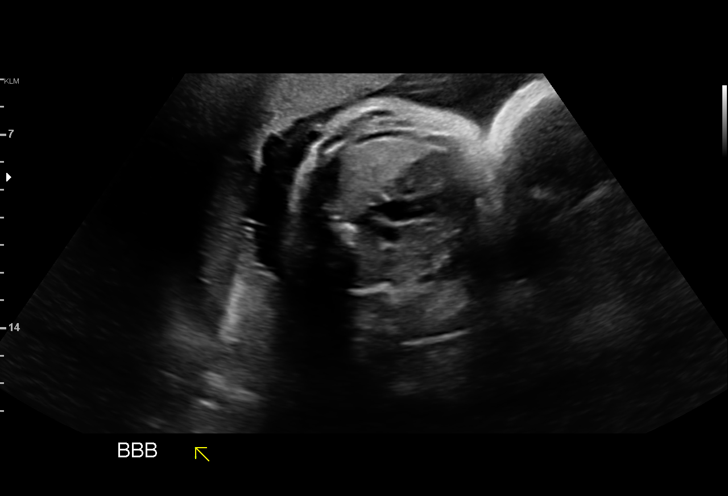
[im 27/43]
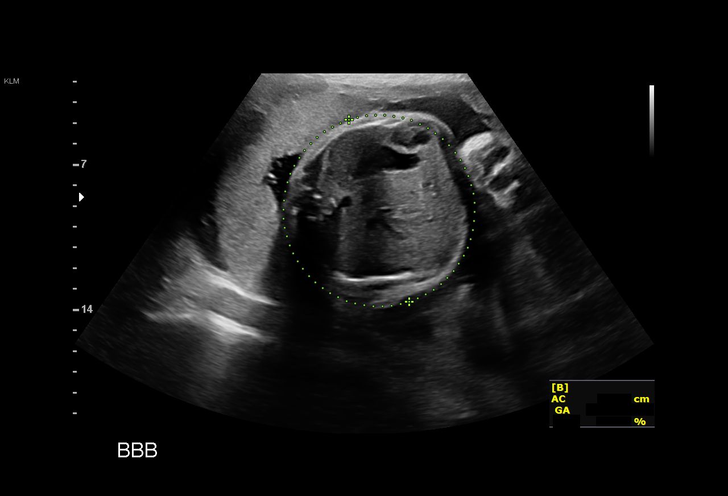
[im 30/43]
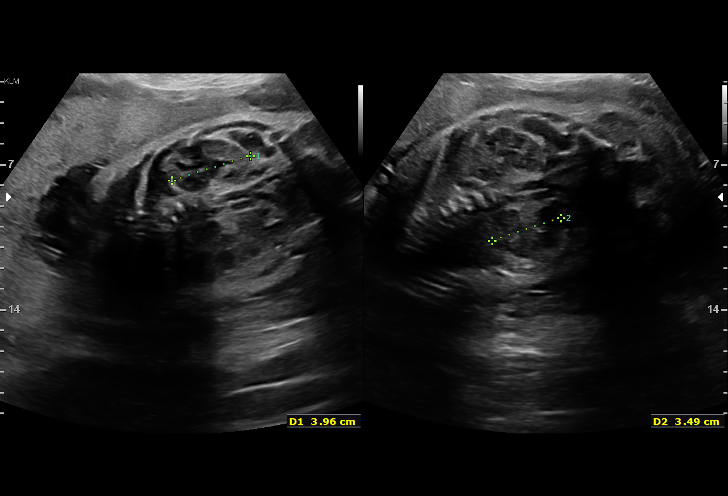
[im 35/43]
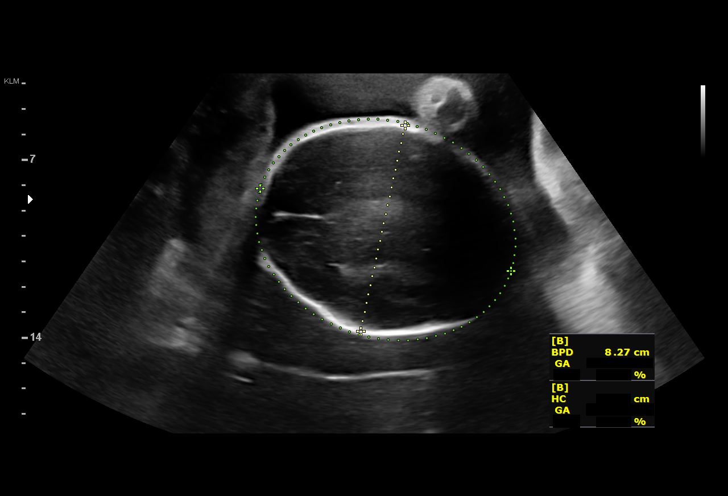
[im 38/43]
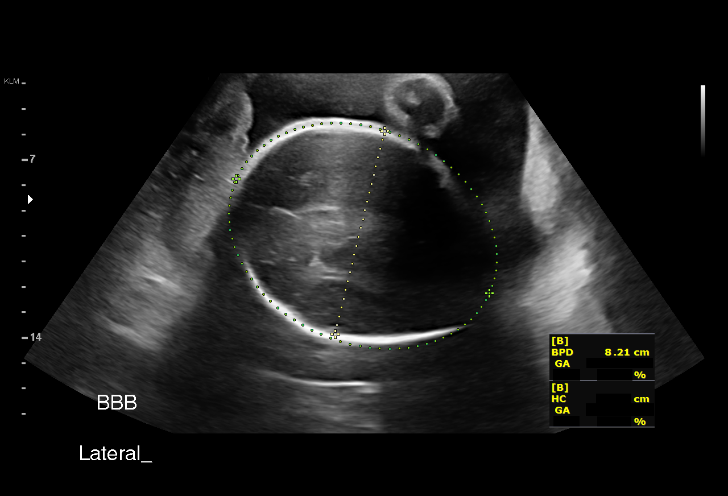
[im 41/43]
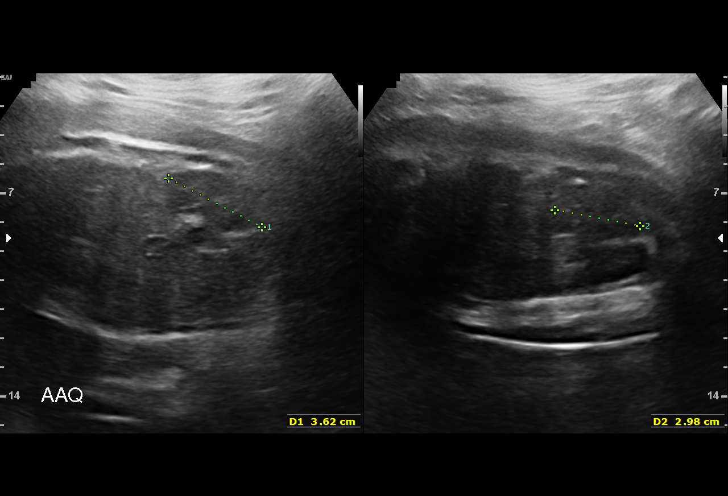

[12 of 28 positions shown; findings below may reference images not displayed]

Attending:        Ichika Gonzalis      Secondary Phy.:    [facility]
                                                             Care

     GEST
 ----------------------------------------------------------------------

 ----------------------------------------------------------------------
Indications

  Premature rupture of membranes - leaking
  fluid (positive FERN)
  31 weeks gestation of pregnancy
  Obesity complicating pregnancy, third
  trimester (BMI 38)
  Late to prenatal care, third trimester
 ----------------------------------------------------------------------
Fetal Evaluation (Fetus A)

 Num Of Fetuses:          2
 Fetal Heart Rate(bpm):   144
 Cardiac Activity:        Observed
 Fetal Lie:               Maternal left side
 Presentation:            Breech
 Placenta:                Anterior
 P. Cord Insertion:       Previously Visualized
 Membrane Desc:      Dividing Membrane seen

 Amniotic Fluid
 AFI FV:      Subjectively low-normal

                             Largest Pocket(cm)

Biometry (Fetus A)

 BPD:      81.3  mm     G. Age:  32w 5d         66  %    CI:        74.27   %    70 - 86
                                                         FL/HC:       20.7  %    19.1 -
 HC:      299.5  mm     G. Age:  33w 1d         49  %    HC/AC:       1.06       0.96 -
 AC:      283.1  mm     G. Age:  32w 2d         63  %    FL/BPD:      76.1  %    71 - 87
 FL:       61.9  mm     G. Age:  32w 0d         44  %    FL/AC:       21.9  %    20 - 24
 Est. FW:    9670   gm     4 lb 5 oz     66  %     FW Discordancy         6  %
OB History

 Gravidity:    2         Term:   1        Prem:   0        SAB:   0
 TOP:          0       Ectopic:  0        Living: 1
Gestational Age (Fetus A)

 LMP:           46w 4d        Date:  06/29/17                 EDD:   04/05/18
 U/S Today:     32w 4d                                        EDD:   07/12/18
 Best:          31w 6d     Det. By:  U/S Fetus B              EDD:   07/17/18
                                     (04/03/18)
Anatomy (Fetus A)

 Cranium:               Appears normal         LVOT:                   Previously seen
 Cavum:                 Previously seen        Aortic Arch:            Not well visualized
 Ventricles:            Appears normal         Ductal Arch:            Previously seen
 Choroid Plexus:        Previously seen        Diaphragm:              Previously seen
 Cerebellum:            Previously seen        Stomach:                Appears normal, left
                                                                       sided
 Posterior Fossa:       Previously seen        Abdomen:                Appears normal
 Nuchal Fold:           Previously seen        Abdominal Wall:         Previously seen
 Face:                  Orbits and profile     Cord Vessels:           Previously seen
                        previously seen
 Lips:                  Previously seen        Kidneys:                Appear normal
 Palate:                Not well visualized    Bladder:                Appears normal
 Thoracic:              Appears normal         Spine:                  Previously seen
 Heart:                 Previously seen        Upper Extremities:      Previously seen
 RVOT:                  Previously seen        Lower Extremities:      Previously seen

 Other:  Heels ands Nasal bone previously visualized. Technically difficult due
         to maternal habitus and fetal position.

Fetal Evaluation (Fetus B)

 Num Of Fetuses:          2
 Fetal Heart Rate(bpm):   141
 Cardiac Activity:        Observed
 Fetal Lie:               Upper Fetus
 Presentation:            Transverse, head to maternal right
 Placenta:                Posterior
 P. Cord Insertion:       Not well visualized
 Membrane Desc:      Dividing Membrane seen

 Amniotic Fluid
 AFI FV:      Within normal limits

                             Largest Pocket(cm)

Biometry (Fetus B)

 BPD:      82.2  mm     G. Age:  33w 1d         76  %    CI:        74.71   %    70 - 86
                                                         FL/HC:       20.6  %    19.1 -
 HC:      301.8  mm     G. Age:  33w 3d         58  %    HC/AC:       1.03       0.96 -
 AC:      292.7  mm     G. Age:  33w 2d         84  %    FL/BPD:      75.7  %    71 - 87
 FL:       62.2  mm     G. Age:  32w 1d         48  %    FL/AC:       21.3  %    20 - 24
 Est. FW:    5050   gm   4 lb 10 oz      76  %     FW Discordancy      0 \ 6 %
Gestational Age (Fetus B)

 LMP:           46w 4d        Date:  06/29/17                 EDD:   04/05/18
 U/S Today:     33w 0d                                        EDD:   07/09/18
 Best:          31w 6d     Det. By:  U/S Fetus B              EDD:   07/17/18
                                     (04/03/18)
Anatomy (Fetus B)

 Cranium:               Appears normal         LVOT:                   Appears normal
 Cavum:                 Previously seen        Aortic Arch:            Not well visualized
 Ventricles:            Appears normal         Ductal Arch:            Previously seen
 Choroid Plexus:        Previously seen        Diaphragm:              Appears normal
 Cerebellum:            Previously seen        Stomach:                Appears normal, left
                                                                       sided
 Posterior Fossa:       Previously seen        Abdomen:                Appears normal
 Nuchal Fold:           Not applicable (>20    Abdominal Wall:         Previously seen
                        wks GA)
 Face:                  Orbits and profile     Cord Vessels:           Previously seen
                        previously seen
 Lips:                  Previously seen        Kidneys:                Appear normal
 Palate:                Not well visualized    Bladder:                Appears normal
 Thoracic:              Appears normal         Spine:                  Previously seen
 Heart:                 Appears normal         Upper Extremities:      Previously seen
                        (4CH, axis, and
                        situs)
 RVOT:                  Previously seen        Lower Extremities:      Previously seen

 Other:  Technically difficult due to maternal habitus and fetal position.
Cervix Uterus Adnexa

 Cervix
 Not visualized (advanced GA >37wks)
Impression

 Monochorionic diamniotic twin gestation normal growth
 No signs of TTTS
Recommendations

 Repeat testing in 1-2 weeks.

## 2020-01-31 IMAGING — US US MFM FETAL BPP WO NST ADDL GESTATION
1 series · 14 of 22 positions shown · non-contrast
Comparison: none

[Series 1: us mfm fetal bpp wo nst addl gestation · 22 acquisitions, 14 frames shown]
[im 1/22]
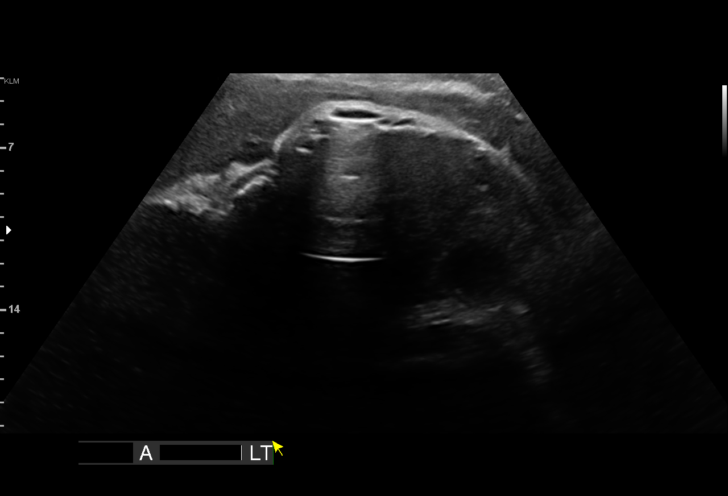
[im 3/22]
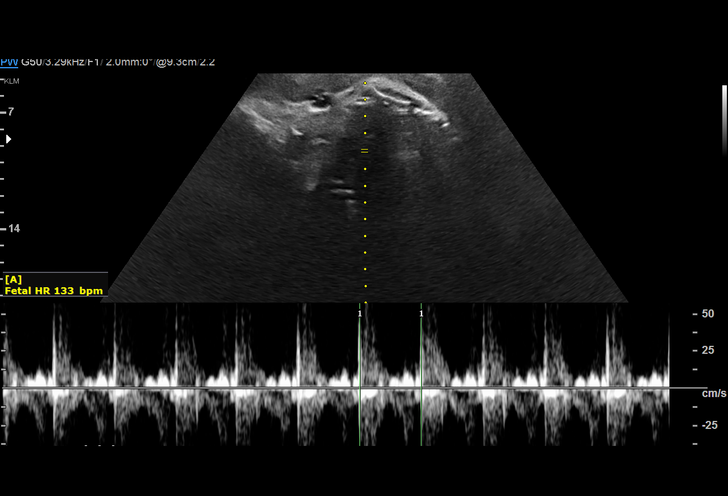
[im 4/22]
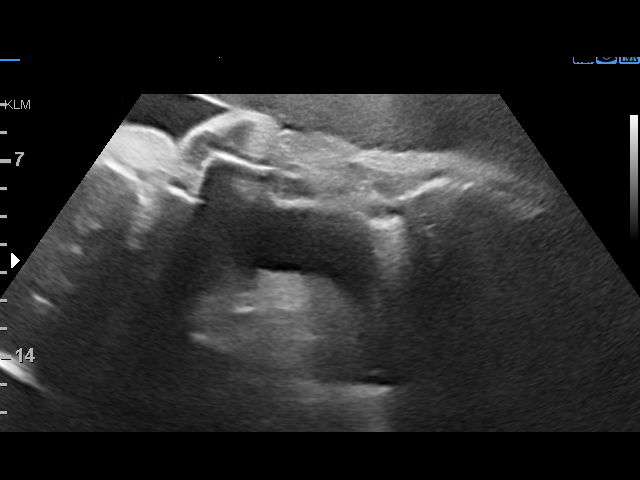
[im 6/22]
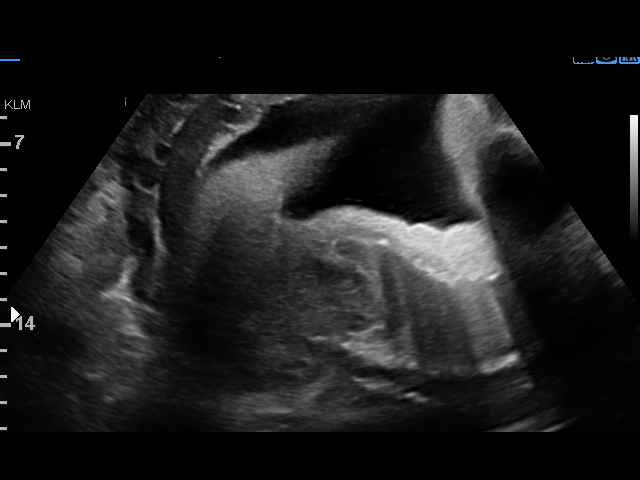
[im 8/22]
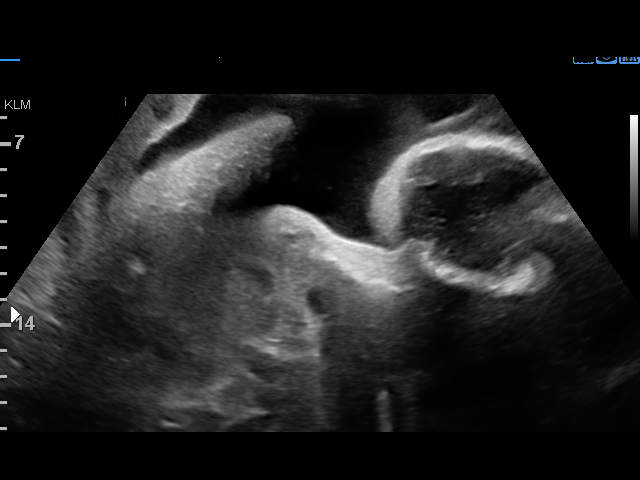
[im 9/22]
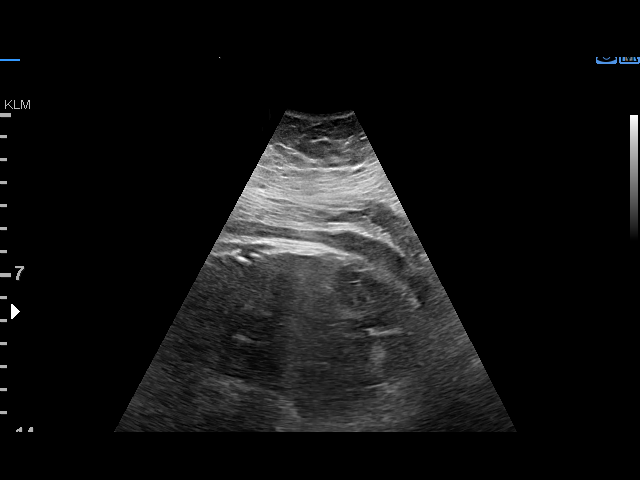
[im 11/22]
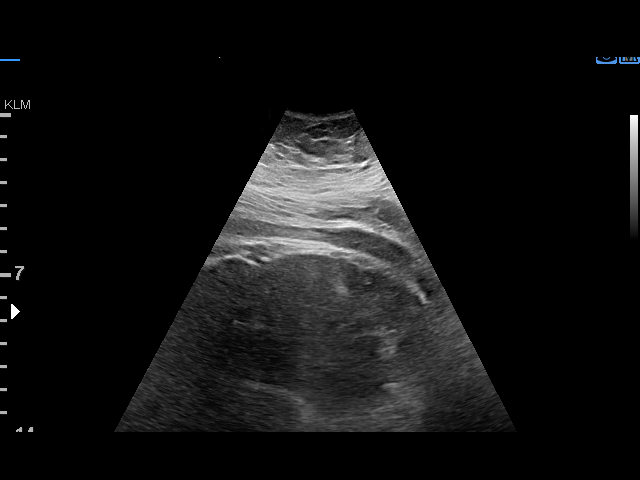
[im 12/22]
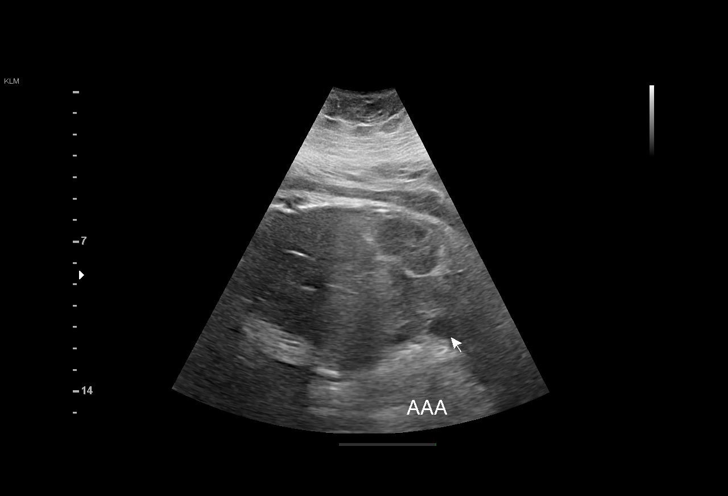
[im 14/22]
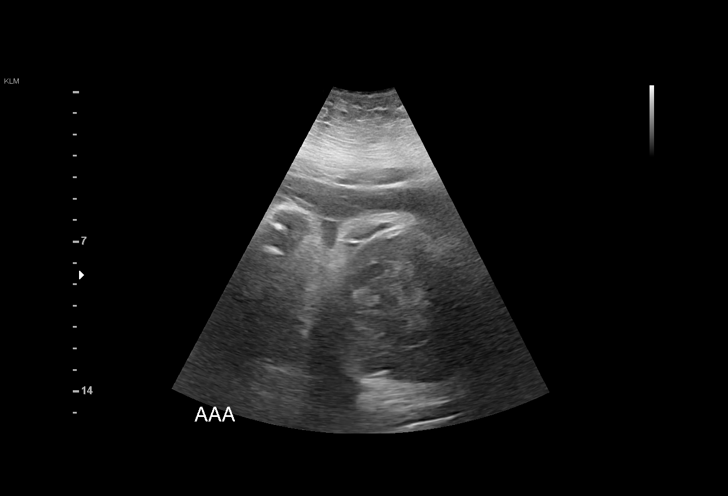
[im 15/22]
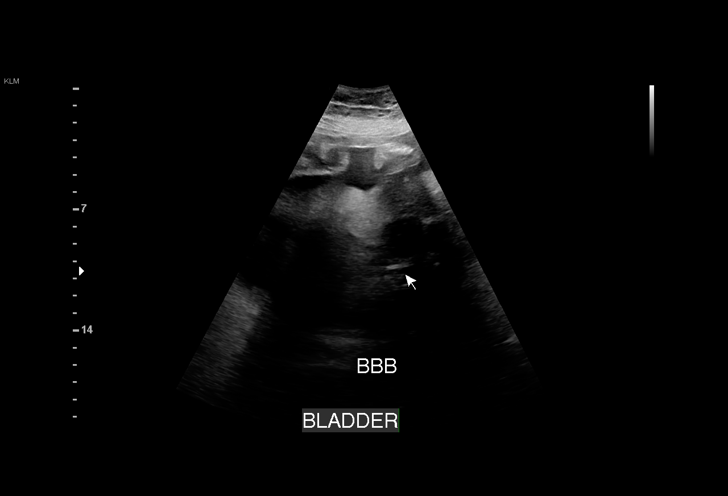
[im 17/22]
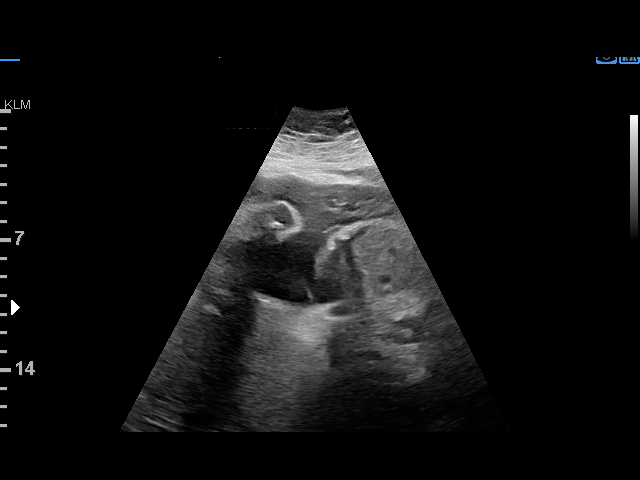
[im 19/22]
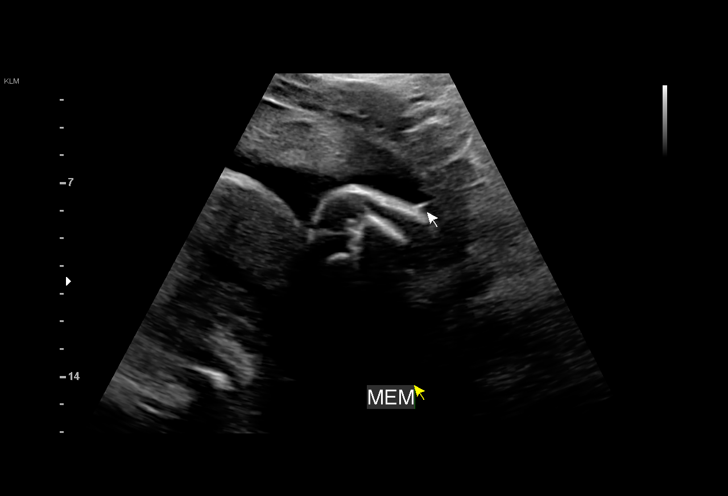
[im 20/22]
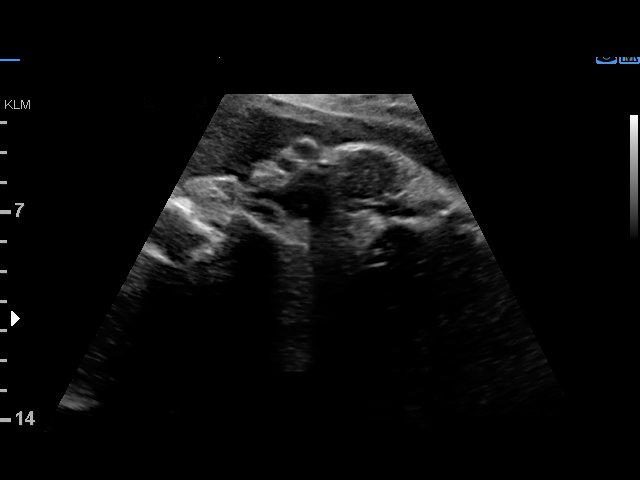
[im 22/22]
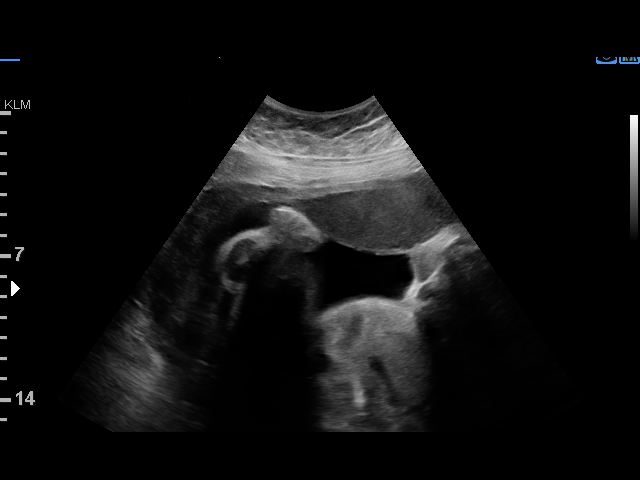

[14 of 22 positions shown; findings below may reference images not displayed]

Attending:        Kinichi Irrutia        Secondary Phy.:   [facility]
                                                            Care

     ADDL GESTATION
 ----------------------------------------------------------------------

 ----------------------------------------------------------------------
Indications

  Twin pregnancy, Tiger/Santha, third trimester
  32 weeks gestation of pregnancy
  Premature rupture of membranes - leaking
  fluid (positive FERN)
  Obesity complicating pregnancy, third
  trimester (BMI 38)
  Late to prenatal care, third trimester
 ----------------------------------------------------------------------
Fetal Evaluation (Fetus A)

 Num Of Fetuses:         2
 Fetal Heart Rate(bpm):  133
 Cardiac Activity:       Observed
 Fetal Lie:              Maternal left side
 Presentation:           Breech
 Placenta:               Anterior

 Amniotic Fluid
 AFI FV:      Anhydramnios
Biophysical Evaluation (Fetus A)

 Amniotic F.V:   Anhydramnios               F. Tone:        Observed
 F. Movement:    Observed                   Score:          [DATE]
 F. Breathing:   Observed
OB History
 Gravidity:    2         Term:   1        Prem:   0        SAB:   0
 TOP:          0       Ectopic:  0        Living: 1
Gestational Age (Fetus A)

 LMP:           47w 2d        Date:  06/29/17                 EDD:   04/05/18
 Best:          32w 4d     Det. By:  U/S Fetus B              EDD:   07/17/18
                                     (04/03/18)
Anatomy (Fetus A)

 Stomach:               Appears normal, left   Bladder:                Appears normal
                        sided

Fetal Evaluation (Fetus B)

 Num Of Fetuses:         2
 Fetal Heart Rate(bpm):  127
 Cardiac Activity:       Observed
 Fetal Lie:              Maternal right side
 Presentation:           Breech
 Placenta:               Posterior

 Amniotic Fluid
 AFI FV:      Within normal limits
Biophysical Evaluation (Fetus B)

 Amniotic F.V:   Within normal limits       F. Tone:        Observed
 F. Movement:    Observed                   Score:          [DATE]
 F. Breathing:   Observed
Gestational Age (Fetus B)

 LMP:           47w 2d        Date:  06/29/17                 EDD:   04/05/18
 Best:          32w 4d     Det. By:  U/S Fetus B              EDD:   07/17/18
                                     (04/03/18)
Anatomy (Fetus B)

 Stomach:               Appears normal, left   Bladder:                Appears normal
                        sided
Impression

 Monochorionic-diamniotic twin pregnancy.
 Patient was admitted with the diagnosis of PPROM.
 Twin A: Maternal left, breech, anterior placenta. No
 measurable pocket of amniotic fluid is seen (anhydramnios).
 Good fetal movements and fetal breathing movements are
 seen. BPP [DATE] (no fluid).
 Twin B: Maternal right, breech, posterior placenta. Amniotic
 fluid is normal. Antenatal testing is reassuring. BPP [DATE].
 Chorionicity was established in second trimester and the
 patient was counseled on the possibility of error.
 Expectant management is possible after 34 weeks in
 pregnancies complicated by PPROM. However,
 monochorionic-diamniotic pregnancy and no measurable
 amniotic fluid in twin A carries a higher risk of fetal
 complications including stillbirth. I recommend delivery at 34
 weeks.
Recommendations

 -BPP next week.
 -Delivery at 34 weeks.
                 Iv, Maffi

## 2020-06-30 ENCOUNTER — Telehealth: Payer: Self-pay | Admitting: *Deleted

## 2020-06-30 NOTE — Telephone Encounter (Signed)
Pt noticed a mole on her arm that has changed and wanted to know if she should come in to have this checked by Dr. Madilyn Fireman or be referred to dermatology.  appt scheduled for 5/24 to get this done

## 2020-07-04 ENCOUNTER — Other Ambulatory Visit: Payer: Self-pay | Admitting: Family Medicine

## 2020-07-04 ENCOUNTER — Ambulatory Visit (INDEPENDENT_AMBULATORY_CARE_PROVIDER_SITE_OTHER): Payer: 59 | Admitting: Family Medicine

## 2020-07-04 ENCOUNTER — Encounter: Payer: Self-pay | Admitting: Family Medicine

## 2020-07-04 ENCOUNTER — Other Ambulatory Visit: Payer: Self-pay

## 2020-07-04 VITALS — BP 127/80 | HR 58 | Ht 66.0 in | Wt 219.0 lb

## 2020-07-04 DIAGNOSIS — L989 Disorder of the skin and subcutaneous tissue, unspecified: Secondary | ICD-10-CM | POA: Diagnosis not present

## 2020-07-04 NOTE — Patient Instructions (Signed)
Okay to clean with soap and water in the shower do not scrub at the area.  Avoid alcohol or peroxide.  Apply Vaseline twice a day.  Okay to keep covered for a couple of days. Call if any concerns about wound healing.  We should have the biopsy per report back in about a week.

## 2020-07-04 NOTE — Addendum Note (Signed)
Addended by: Teddy Spike on: 07/04/2020 05:38 PM   Modules accepted: Orders

## 2020-07-04 NOTE — Progress Notes (Signed)
Established Patient Office Visit  Subjective:  Patient ID: Valerie Shaw, female    DOB: [birth year]  Age: 35 y.o. MRN: [id number]  CC:  Chief Complaint  Patient presents with  . Nevus    L arm     HPI [name] presents for changing mole on her left upper arm just above the elbow.  She says it is gotten larger to the point that a friend and a family member have noticed.  She says it used to be really tiny she just wants to make sure that it is okay.  She does tan easily.  And does get a lot of sun exposure.  No family history of skin cancer.  Past Medical History:  Diagnosis Date  . Complication of anesthesia    have a hard time waking her up, felt hot, nausea and vomiting  . Foot fracture    age 31  . Headache   . Infertility, female    clomid and stimulation meds in past; treated for years   . Mucinous cystadenoma   . PCOS (polycystic ovarian syndrome)   . Supervision of high risk pregnancy, antepartum 02/23/2018    Nursing Staff Provider Office Location  [place] Dating   2nd trimester Language   [place] Anatomy US   Flu Vaccine  02/23/18  Genetic Screen  declines   TDaP vaccine    Hgb A1C or  GTT Early: 5.1 Third trimester  Rhogam   n/a   LAB RESULTS  Feeding Plan  breast/bottle Blood Type B/RH(D) POSITIVE/-- (01/13 1121) B+ Contraception  unsure Antibody NO ANTIBODIES DETECTED (01/13 1121) Circumcision  Rubel  . Weight gain     Past Surgical History:  Procedure Laterality Date  . ARTHROSCOPIC REPAIR ACL    . CESAREAN SECTION    . CESAREAN SECTION N/A 05/31/2018   Procedure: CESAREAN SECTION;  Surgeon: [name], MD;  Location: [facility];  Service: Obstetrics;  Laterality: N/A;  . LAPAROSCOPIC BILATERAL SALPINGO OOPHERECTOMY N/A 06/19/2017   Procedure: EXPLORATORY LAPAROTOMY RIGHT SALPINGO OOPHORECTOMY;  Surgeon: Isabel Caprice, MD;  Location: [facility];  Service: Gynecology;  Laterality: N/A;  . WISDOM TOOTH EXTRACTION      Family History  Problem  Relation Age of Onset  . Hypothyroidism Mother   . Drug abuse Father   . Hypertension Other   . Cancer Paternal Uncle        unkown primary  . Lung cancer Maternal Grandfather        smoker    Social History   Socioeconomic History  . Marital status: Married    Spouse name: Not on file  . Number of children: Not on file  . Years of education: Not on file  . Highest education level: Not on file  Occupational History  . Not on file  Tobacco Use  . Smoking status: Current Some Day Smoker    Packs/day: 0.50    Types: Cigarettes    Last attempt to quit: 02/17/2018    Years since quitting: 2.3  . Smokeless tobacco: Never Used  . Tobacco comment: none since she found out she was pregnant  Vaping Use  . Vaping Use: Never used  Substance and Sexual Activity  . Alcohol use: Not Currently    Comment: 2 / month  . Drug use: No  . Sexual activity: Yes    Birth control/protection: Coitus interruptus  Other Topics Concern  . Not on file  Social History Narrative  Exercising some.    Social Determinants of Health   Financial Resource Strain: Not on file  Food Insecurity: Not on file  Transportation Needs: Not on file  Physical Activity: Not on file  Stress: Not on file  Social Connections: Not on file  Intimate Partner Violence: Not on file    Outpatient Medications Prior to Visit  Medication Sig Dispense Refill  . metFORMIN (GLUCOPHAGE) 500 MG tablet Take 1 tablet (500 mg total) by mouth 2 (two) times daily with a meal. 60 tablet 3  . levonorgestrel (LILETTA) 19.5 MCG/DAY IUD IUD 1 each by Intrauterine route once.     No facility-administered medications prior to visit.    No Known Allergies  ROS Review of Systems    Objective:    Physical Exam  BP 127/80   Pulse (!) 58   Ht 5\' 6"  (1.676 m)   Wt 219 lb (99.3 kg)   SpO2 100%   BMI 35.35 kg/m  Wt Readings from Last 3 Encounters:  07/04/20 219 lb (99.3 kg)  01/21/20 216 lb (98 kg)  01/18/19 250 lb (113.4  kg)     Health Maintenance Due  Topic Date Due  . COVID-19 Vaccine (1) Never done  . Hepatitis C Screening  Never done    There are no preventive care reminders to display for this patient.  Lab Results  Component Value Date   TSH 1.19 01/21/2020   Lab Results  Component Value Date   WBC 12.4 (H) 01/21/2020   HGB 14.5 01/21/2020   HCT 43.1 01/21/2020   MCV 88.0 01/21/2020   PLT 283 01/21/2020   Lab Results  Component Value Date   NA 137 01/21/2020   K 4.1 01/21/2020   CO2 28 01/21/2020   GLUCOSE 79 01/21/2020   BUN 17 01/21/2020   CREATININE 0.88 01/21/2020   BILITOT 0.4 01/21/2020   ALKPHOS 75 06/18/2017   AST 13 01/21/2020   ALT 11 01/21/2020   PROT 7.1 01/21/2020   ALBUMIN 4.1 06/18/2017   CALCIUM 9.5 01/21/2020   ANIONGAP 9 06/20/2017   Lab Results  Component Value Date   CHOL 175 01/21/2020   Lab Results  Component Value Date   HDL 46 (L) 01/21/2020   Lab Results  Component Value Date   LDLCALC 108 (H) 01/21/2020   Lab Results  Component Value Date   TRIG 117 01/21/2020   Lab Results  Component Value Date   CHOLHDL 3.8 01/21/2020   Lab Results  Component Value Date   HGBA1C 5.1 02/23/2018      Assessment & Plan:   Problem List Items Addressed This Visit   None   Visit Diagnoses    Skin lesion    -  Primary     Skin lesion-with recent change in shape and size.  Discussed options including following it or going ahead and doing a shave biopsy for further work-up.  Shave biopsy performed.  Patient tolerated well.  Follow-up wound care discussed.  Shave Biopsy Procedure Note  Pre-operative Diagnosis: Suspicious lesion  Post-operative Diagnosis: same  Locations:left upper arm near the elbow  Indications: getting more raised.   Anesthesia: Lidocaine 1% with epinephrine    Procedure Details  Patient informed of the risks (including bleeding and infection) and benefits of the  procedure and Verbal informed consent  obtained.  The lesion and surrounding area were given a sterile prep using chlorhexidine and draped in the usual sterile fashion. A scalpel was used to shave an area  of skin approximately 58mm by 17mm.  Hemostasis achieved with alumuninum chloride. Antibiotic ointment and a sterile dressing applied.  The specimen was sent for pathologic examination. The patient tolerated the procedure well.  EBL: trace   Findings: Await pathology  Condition: Stable  Complications: none.  Plan: 1. Instructed to keep the wound dry and covered for 24-48h and clean thereafter. 2. Warning signs of infection were reviewed.   3. Recommended that the patient use OTC acetaminophen as needed for pain.  4. Return PRN.    No orders of the defined types were placed in this encounter.   Follow-up: No follow-ups on file.    Beatrice Lecher, MD

## 2020-07-13 ENCOUNTER — Telehealth: Payer: Self-pay | Admitting: *Deleted

## 2020-07-13 NOTE — Telephone Encounter (Signed)
Pt was calling because she hasn't heard back about the skin lesion that was done on 5/24 and sent to pathology.   No results have been released in her chart and its showing as a derm path and a surgical path.  Will need to call to get more information

## 2020-12-11 ENCOUNTER — Encounter: Payer: Self-pay | Admitting: Physician Assistant

## 2020-12-11 ENCOUNTER — Telehealth (INDEPENDENT_AMBULATORY_CARE_PROVIDER_SITE_OTHER): Payer: 59 | Admitting: Physician Assistant

## 2020-12-11 DIAGNOSIS — J0100 Acute maxillary sinusitis, unspecified: Secondary | ICD-10-CM

## 2020-12-11 DIAGNOSIS — H9202 Otalgia, left ear: Secondary | ICD-10-CM

## 2020-12-11 MED ORDER — AMOXICILLIN-POT CLAVULANATE 875-125 MG PO TABS: 1.0000 | ORAL_TABLET | Freq: Two times a day (BID) | ORAL | 0 refills | Status: AC

## 2020-12-11 NOTE — Progress Notes (Signed)
Patient ID: EGYPT WELCOME, female   DOB: 1985-06-14, 35 y.o.   MRN: [id number] .Marland KitchenVirtual Visit via Video Note  I connected with Valerie Shaw on 12/11/20 at 10:50 AM EDT by a video enabled telemedicine application and verified that I am speaking with the correct person using two identifiers.  Location: Patient: home Provider: clinic  .Marland KitchenParticipating in visit:  Patient: Valerie Shaw Provider: Iran Planas PA-C   I discussed the limitations of evaluation and management by telemedicine and the availability of in person appointments. The patient expressed understanding and agreed to proceed.  History of Present Illness: Pt is a 35 yo female with over a week of URI symptoms of cough, sinus pressure, rhinorrhea, headache, malaise. Her symptoms have gotten worse with more sinus pressure and left ear pain. She feels like her ear is "going to explode". She has twins with RSV and ear infections. No fever, chills, body aches, SOB. Taking OTC cold and cough rx with no improvement.    .. Active Ambulatory Problems    Diagnosis Date Noted   BMI 39.0-39.9,adult 12/16/2016   H/O unilateral oophorectomy 05/22/2018   Mucinous cystadenoma    PCOS (polycystic ovarian syndrome) 01/18/2019   Left ear pain 12/11/2020   Resolved Ambulatory Problems    Diagnosis Date Noted   BRONCHITIS, ACUTE 10/27/2007   SHOULDER PAIN, LEFT 04/22/2008   Atypical chest pain 07/04/2010   Abnormal weight gain 12/16/2016   Pelvic mass 06/17/2017   Pelvic mass in female 06/19/2017   Supervision of high risk pregnancy, antepartum 02/23/2018   Preterm premature rupture of membranes (PPROM) delivered, current hospitalization 05/21/2018   Monochorionic diamniotic twin gestation 05/22/2018   Late prenatal care 05/22/2018   S/P cesarean section 05/31/2018   Postpartum anemia 06/01/2018   Past Medical History:  Diagnosis Date   Complication of anesthesia    Foot fracture    Headache    Infertility, female    Weight  gain     Observations/Objective: No acute distress Normal mood and appearance  .Marland KitchenThere were no vitals filed for this visit. There is no height or weight on file to calculate BMI.    Assessment and Plan: .Marland KitchenAnalynn was seen today for ear pain.  Diagnoses and all orders for this visit:  Left ear pain -     amoxicillin-clavulanate (AUGMENTIN) 875-125 MG tablet; Take 1 tablet by mouth 2 (two) times daily for 10 days.  Acute non-recurrent maxillary sinusitis -     amoxicillin-clavulanate (AUGMENTIN) 875-125 MG tablet; Take 1 tablet by mouth 2 (two) times daily for 10 days.  URI symptoms for over a week. 2 twins with RSV and ear infections. Left ear pain for 3 days.  Treated for sinus infection with augmentin and flonase.  Continue other symptomatic treatment.  Follow up in office is symptoms persist.    Follow Up Instructions:    I discussed the assessment and treatment plan with the patient. The patient was provided an opportunity to ask questions and all were answered. The patient agreed with the plan and demonstrated an understanding of the instructions.   The patient was advised to call back or seek an in-person evaluation if the symptoms worsen or if the condition fails to improve as anticipated.    Iran Planas, PA-C

## 2020-12-22 ENCOUNTER — Ambulatory Visit (INDEPENDENT_AMBULATORY_CARE_PROVIDER_SITE_OTHER): Payer: 59 | Admitting: Family Medicine

## 2020-12-22 ENCOUNTER — Encounter: Payer: Self-pay | Admitting: Family Medicine

## 2020-12-22 ENCOUNTER — Other Ambulatory Visit: Payer: Self-pay

## 2020-12-22 VITALS — BP 127/85 | HR 65 | Temp 98.2°F | Wt 220.1 lb

## 2020-12-22 DIAGNOSIS — H6593 Unspecified nonsuppurative otitis media, bilateral: Secondary | ICD-10-CM | POA: Diagnosis not present

## 2020-12-22 MED ORDER — PREDNISONE 20 MG PO TABS: 20.0000 mg | ORAL_TABLET | Freq: Every day | ORAL | 0 refills | Status: AC

## 2020-12-22 NOTE — Patient Instructions (Signed)
No signs of infection today. The effusion may take awhile to fully resolve - educational handout attached to this packet.  Try starting a daily allergy medicine (Allegra, Claritin, Zyrtec, etc) Continue using Flonase 1 spray twice a day or 2 sprays once a day If no improvement after the weekend, can take the prednisone

## 2020-12-22 NOTE — Progress Notes (Signed)
Acute Office Visit  Subjective:    Patient ID: Valerie Shaw, female    DOB: 1986/01/10, 35 y.o.   MRN: [id number]  Chief Complaint  Patient presents with   Otalgia    "Clogged"    Otalgia   Patient is in today for ear discomfort.  About 2 weeks ago she developed bilateral ear pain and headache after her young children had RSV and ear infections. She did a virtual visit on 12/11/20 and was given Augmentin. States she took about 8 days of ABX, but stopped before finishing because she didn't find it helpful. States the pain went away, but the fullness continues. Today she reports continued bilateral ear pressure (right worse than left), feeling "clogged," muffled hearing, occasional popping, and occasional mild frontal headaches. She has been using Flonase 1 spray daily.  She has not had any fevers, chills, purulent drainage, other URI symptoms.     Past Medical History:  Diagnosis Date   Complication of anesthesia    have a hard time waking her up, felt hot, nausea and vomiting   Foot fracture    age 69   Headache    Infertility, female    clomid and stimulation meds in past; treated for years    Mucinous cystadenoma    PCOS (polycystic ovarian syndrome)    Supervision of high risk pregnancy, antepartum 02/23/2018    Nursing Staff Provider Office Location  [place] Dating   2nd trimester Language   [facility]   Flu Vaccine  02/23/18  Genetic Screen  declines   TDaP vaccine    Hgb A1C or  GTT Early: 5.1 Third trimester  Rhogam   n/a   LAB RESULTS  Feeding Plan  breast/bottle Blood Type B/RH(D) POSITIVE/-- (01/13 1121) B+ Contraception  unsure Antibody NO ANTIBODIES DETECTED (01/13 1121) Circumcision  Rubel   Weight gain     Past Surgical History:  Procedure Laterality Date   ARTHROSCOPIC REPAIR ACL     CESAREAN SECTION     CESAREAN SECTION N/A 05/31/2018   Procedure: CESAREAN SECTION;  Surgeon: [name], MD;  Location: [facility];  Service: Obstetrics;   Laterality: N/A;   LAPAROSCOPIC BILATERAL SALPINGO OOPHERECTOMY N/A 06/19/2017   Procedure: EXPLORATORY LAPAROTOMY RIGHT SALPINGO OOPHORECTOMY;  Surgeon: Isabel Caprice, MD;  Location: [facility];  Service: Gynecology;  Laterality: N/A;   WISDOM TOOTH EXTRACTION      Family History  Problem Relation Age of Onset   Hypothyroidism Mother    Drug abuse Father    Hypertension Other    Cancer Paternal Uncle        unkown primary   Lung cancer Maternal Grandfather        smoker    Social History   Socioeconomic History   Marital status: Married    Spouse name: Not on file   Number of children: Not on file   Years of education: Not on file   Highest education level: Not on file  Occupational History   Not on file  Tobacco Use   Smoking status: Some Days    Packs/day: 0.50    Types: Cigarettes    Last attempt to quit: 02/17/2018    Years since quitting: 2.8   Smokeless tobacco: Never   Tobacco comments:    none since she found out she was pregnant  Vaping Use   Vaping Use: Never used  Substance and Sexual Activity   Alcohol use: Not Currently    Comment:  2 / month   Drug use: No   Sexual activity: Yes    Birth control/protection: Coitus interruptus  Other Topics Concern   Not on file  Social History Narrative   Exercising some.    Social Determinants of Health   Financial Resource Strain: Not on file  Food Insecurity: Not on file  Transportation Needs: Not on file  Physical Activity: Not on file  Stress: Not on file  Social Connections: Not on file  Intimate Partner Violence: Not on file    Outpatient Medications Prior to Visit  Medication Sig Dispense Refill   metFORMIN (GLUCOPHAGE) 500 MG tablet Take 1 tablet (500 mg total) by mouth 2 (two) times daily with a meal. 60 tablet 3   No facility-administered medications prior to visit.    No Known Allergies  Review of Systems All review of systems negative except what is listed in the HPI     Objective:     Physical Exam Vitals reviewed.  Constitutional:      Appearance: Normal appearance.  HENT:     Head: Normocephalic and atraumatic.     Ears:     Comments: Bilateral effusions R>L, no erythema, no pain on palpation of pinna  Cardiovascular:     Rate and Rhythm: Normal rate and regular rhythm.  Pulmonary:     Effort: Pulmonary effort is normal.     Breath sounds: Normal breath sounds.  Musculoskeletal:     Cervical back: Normal range of motion and neck supple.  Skin:    General: Skin is warm and dry.  Neurological:     Mental Status: She is alert and oriented to person, place, and time.  Psychiatric:        Mood and Affect: Mood normal.        Behavior: Behavior normal.        Thought Content: Thought content normal.        Judgment: Judgment normal.    BP 127/85 (BP Location: Left Arm, Patient Position: Sitting, Cuff Size: Normal)   Pulse 65   Temp 98.2 F (36.8 C) (Oral)   Wt 220 lb 1.3 oz (99.8 kg)   SpO2 100%   BMI 35.52 kg/m  Wt Readings from Last 3 Encounters:  12/22/20 220 lb 1.3 oz (99.8 kg)  07/04/20 219 lb (99.3 kg)  01/21/20 216 lb (98 kg)    Health Maintenance Due  Topic Date Due   Hepatitis C Screening  Never done    There are no preventive care reminders to display for this patient.   Lab Results  Component Value Date   TSH 1.19 01/21/2020   Lab Results  Component Value Date   WBC 12.4 (H) 01/21/2020   HGB 14.5 01/21/2020   HCT 43.1 01/21/2020   MCV 88.0 01/21/2020   PLT 283 01/21/2020   Lab Results  Component Value Date   NA 137 01/21/2020   K 4.1 01/21/2020   CO2 28 01/21/2020   GLUCOSE 79 01/21/2020   BUN 17 01/21/2020   CREATININE 0.88 01/21/2020   BILITOT 0.4 01/21/2020   ALKPHOS 75 06/18/2017   AST 13 01/21/2020   ALT 11 01/21/2020   PROT 7.1 01/21/2020   ALBUMIN 4.1 06/18/2017   CALCIUM 9.5 01/21/2020   ANIONGAP 9 06/20/2017   Lab Results  Component Value Date   CHOL 175 01/21/2020   Lab Results  Component Value  Date   HDL 46 (L) 01/21/2020   Lab Results  Component Value Date  LDLCALC 108 (H) 01/21/2020   Lab Results  Component Value Date   TRIG 117 01/21/2020   Lab Results  Component Value Date   CHOLHDL 3.8 01/21/2020   Lab Results  Component Value Date   HGBA1C 5.1 02/23/2018       Assessment & Plan:   1. Bilateral otitis media with effusion No signs of infection today. The effusion may take awhile to fully resolve - educational handout attached to AVS  Try starting a daily allergy medicine (Allegra, Claritin, Zyrtec, etc) Continue using Flonase 1 spray twice a day or 2 sprays once a day If no improvement after the weekend, can take the prednisone  Patient aware of signs/symptoms requiring further/urgent evaluation.  - predniSONE (DELTASONE) 20 MG tablet; Take 1 tablet (20 mg total) by mouth daily with breakfast for 5 days.  Dispense: 5 tablet; Refill: 0   Follow-up if symptoms worsen or fail to improve.   Purcell Nails Olevia Bowens, DNP, FNP-C

## 2021-01-23 ENCOUNTER — Other Ambulatory Visit: Payer: Self-pay

## 2021-01-23 ENCOUNTER — Ambulatory Visit (INDEPENDENT_AMBULATORY_CARE_PROVIDER_SITE_OTHER): Payer: 59 | Admitting: Family Medicine

## 2021-01-23 ENCOUNTER — Encounter: Payer: Self-pay | Admitting: Family Medicine

## 2021-01-23 VITALS — BP 122/78 | HR 84 | Ht 66.0 in | Wt 220.0 lb

## 2021-01-23 DIAGNOSIS — Z7689 Persons encountering health services in other specified circumstances: Secondary | ICD-10-CM

## 2021-01-23 DIAGNOSIS — Z0000 Encounter for general adult medical examination without abnormal findings: Secondary | ICD-10-CM

## 2021-01-23 DIAGNOSIS — Z23 Encounter for immunization: Secondary | ICD-10-CM | POA: Diagnosis not present

## 2021-01-23 MED ORDER — SAXENDA 18 MG/3ML ~~LOC~~ SOPN
PEN_INJECTOR | SUBCUTANEOUS | 1 refills | Status: DC
Start: 2021-01-23 — End: 2021-01-26

## 2021-01-23 NOTE — Progress Notes (Signed)
Subjective:     Valerie Shaw is a 35 y.o. female and is here for a comprehensive physical exam. The patient reports problems - vitamin deficiency, and weight management .  She did want to discuss a couple of things.  She really worked hard on getting her weight down last year but feels like she is really just plateaued around 220.  Usually between 214 and 220 is where she has been hanging over this last year and feels like she just cannot quite get past it.  She feels like overall she is eating healthy she is trying to stay active and exercise she is back at work and says that always helps as well.  He also said that she went to [facility] because she was feeling very fatigued.  They did do some testing for vitamin deficiencies and found that she was deficient on vitamin B she is not sure which specific vitamin B she was low on but wanted to see if we had any recommendations.  Still getting occasional small abscesses in the underarm and groin area.  Social History   Socioeconomic History   Marital status: Married    Spouse name: Not on file   Number of children: Not on file   Years of education: Not on file   Highest education level: Not on file  Occupational History   Not on file  Tobacco Use   Smoking status: Some Days    Packs/day: 0.50    Types: Cigarettes    Last attempt to quit: 02/17/2018    Years since quitting: 2.9   Smokeless tobacco: Never   Tobacco comments:    none since she found out she was pregnant  Vaping Use   Vaping Use: Never used  Substance and Sexual Activity   Alcohol use: Not Currently    Comment: 2 / month   Drug use: No   Sexual activity: Yes    Birth control/protection: Coitus interruptus  Other Topics Concern   Not on file  Social History Narrative   Exercising some.    Social Determinants of Health   Financial Resource Strain: Not on file  Food Insecurity: Not on file  Transportation Needs: Not on file  Physical Activity: Not  on file  Stress: Not on file  Social Connections: Not on file  Intimate Partner Violence: Not on file   Health Maintenance  Topic Date Due   COVID-19 Vaccine (1) Never done   Hepatitis C Screening  Never done   Pneumococcal Vaccine 21-72 Years old (1 - PCV) 12/22/2021 (Originally 04/03/1990)   PAP SMEAR-Modifier  01/20/2025   TETANUS/TDAP  04/26/2028   INFLUENZA VACCINE  Completed   HIV Screening  Completed   HPV VACCINES  Aged Out    The following portions of the patient's history were reviewed and updated as appropriate: allergies, current medications, past family history, past medical history, past social history, past surgical history, and problem list.  Review of Systems Pertinent items are noted in HPI.   Objective:    BP 122/78    Pulse 84    Ht 5\' 6"  (1.676 m)    Wt 220 lb (99.8 kg)    SpO2 100%    BMI 35.51 kg/m  General appearance: alert, cooperative, and appears stated age Head: Normocephalic, without obvious abnormality, atraumatic Eyes:  conj clear, EOMI, PEERLA Ears: normal TM's and external ear canals both ears Nose: Nares normal. Septum midline. Mucosa normal. No drainage or sinus tenderness. Throat: lips, mucosa,  and tongue normal; teeth and gums normal Neck: no adenopathy, no carotid bruit, no JVD, supple, symmetrical, trachea midline, and thyroid not enlarged, symmetric, no tenderness/mass/nodules Back: symmetric, no curvature. ROM normal. No CVA tenderness. Lungs: clear to auscultation bilaterally Heart: regular rate and rhythm, S1, S2 normal, no murmur, click, rub or gallop Abdomen: soft, non-tender; bowel sounds normal; no masses,  no organomegaly Extremities: extremities normal, atraumatic, no cyanosis or edema Pulses: 2+ and symmetric Skin: Skin color, texture, turgor normal. No rashes or lesions Lymph nodes: Cervical adenopathy: nl and Supraclavicular adenopathy: nl Neurologic: Grossly normal    Assessment:    Healthy female exam.      Plan:      See After Visit Summary for Counseling Recommendations  Keep up a regular exercise program and make sure you are eating a healthy diet Try to eat 4 servings of dairy a day, or if you are lactose intolerant take a calcium with vitamin D daily.  Your vaccines are up to date.   I do recommend just a general women's One-A-Day multivitamin.  Also encouraged her to take an extra vitamin D through the fall and winter months.  And also consider taking a B complex as I am not sure specifically which vitamin B it was low.  She can always see me a copy of the report and happy to take a look at it.  For today were just can recommend doing basic labs that we will check a CBC.  Weight management-she is very interested in starting one of the newer weight loss medications again she lost 40 pounds last year but just has not been able to get it to move much this year.  We did discuss setting really small goals for diet and activity level.  Also discussed medication options and potential side effects.  She would like to try Saxenda. Goal weight 175 pounds

## 2021-01-24 ENCOUNTER — Telehealth: Payer: Self-pay

## 2021-01-24 LAB — COMPLETE METABOLIC PANEL WITH GFR
AG Ratio: 1.7 (calc) (ref 1.0–2.5)
ALT: 7 U/L (ref 6–29)
AST: 15 U/L (ref 10–30)
Albumin: 4.7 g/dL (ref 3.6–5.1)
Alkaline phosphatase (APISO): 71 U/L (ref 31–125)
BUN: 17 mg/dL (ref 7–25)
CO2: 25 mmol/L (ref 20–32)
Calcium: 10.2 mg/dL (ref 8.6–10.2)
Chloride: 104 mmol/L (ref 98–110)
Creat: 0.9 mg/dL (ref 0.50–0.97)
Globulin: 2.8 g/dL (calc) (ref 1.9–3.7)
Glucose, Bld: 87 mg/dL (ref 65–99)
Potassium: 4.3 mmol/L (ref 3.5–5.3)
Sodium: 138 mmol/L (ref 135–146)
Total Bilirubin: 0.7 mg/dL (ref 0.2–1.2)
Total Protein: 7.5 g/dL (ref 6.1–8.1)
eGFR: 85 mL/min/{1.73_m2} (ref >=60)

## 2021-01-24 LAB — CBC
HCT: 43.2 % (ref 35.0–45.0)
Hemoglobin: 14.6 g/dL (ref 11.7–15.5)
MCH: 30.2 pg (ref 27.0–33.0)
MCHC: 33.8 g/dL (ref 32.0–36.0)
MCV: 89.3 fL (ref 80.0–100.0)
MPV: 11.5 fL (ref 7.5–12.5)
Platelets: 277 10*3/uL (ref 140–400)
RBC: 4.84 10*6/uL (ref 3.80–5.10)
RDW: 12 % (ref 11.0–15.0)
WBC: 6.7 10*3/uL (ref 3.8–10.8)

## 2021-01-24 LAB — LIPID PANEL W/REFLEX DIRECT LDL
Cholesterol: 180 mg/dL (ref <200)
HDL: 44 mg/dL — ABNORMAL LOW (ref >=50)
LDL Cholesterol (Calc): 113 mg/dL (calc) — ABNORMAL HIGH
Non-HDL Cholesterol (Calc): 136 mg/dL (calc) — ABNORMAL HIGH (ref <130)
Total CHOL/HDL Ratio: 4.1 (calc) (ref <5.0)
Triglycerides: 124 mg/dL (ref <150)

## 2021-01-24 LAB — HEPATITIS C ANTIBODY
Hepatitis C Ab: NONREACTIVE
SIGNAL TO CUT-OFF: <0.02 (ref <1.00)

## 2021-01-24 NOTE — Telephone Encounter (Signed)
Patient stated the Kirke Shaggy is not covered by her insurance and would like to know if Dr.Metheney can prescribed a different medication?

## 2021-01-24 NOTE — Progress Notes (Signed)
Hi Valerie Shaw, LDL is just slightly elevated.  Continue to work on healthy diet and regular exercise.  Kidney and liver function are normal.  Blood count looks great no sign of anemia.  Negative for hepatitis C.

## 2021-01-26 ENCOUNTER — Telehealth: Payer: Self-pay | Admitting: *Deleted

## 2021-01-26 MED ORDER — OZEMPIC (0.25 OR 0.5 MG/DOSE) 2 MG/1.5ML ~~LOC~~ SOPN: 0.2500 mg | PEN_INJECTOR | SUBCUTANEOUS | 0 refills | Status: DC

## 2021-01-26 NOTE — Telephone Encounter (Signed)
Form completed,faxed,confirmation received and scanned into patient's chart. 

## 2021-01-26 NOTE — Telephone Encounter (Signed)
Pt informed.  Pt expressed understanding and is agreeable.  T. Alleigh Mollica, CMA  

## 2021-01-26 NOTE — Telephone Encounter (Signed)
Okay, new prescription sent for Ozempic that we have been having some difficulty getting it but it certainly worth a try.  If her insurance will not cover this 1 then she may need to call them to find out if there are any medications for weight loss that they will cover.  If they want then we can consider off label use for some alternatives.

## 2021-02-02 ENCOUNTER — Telehealth: Payer: Self-pay

## 2021-02-02 NOTE — Telephone Encounter (Signed)
Medication: Semaglutide,0.25 or 0.5MG /DOS, (OZEMPIC, 0.25 OR 0.5 MG/DOSE,) 2 MG/1.5ML SOPN Prior authorization determination received Per CoverMyMeds:  Drug is covered by current benefit plan. No further PA activity needed"

## 2021-02-02 NOTE — Telephone Encounter (Signed)
Medication: Semaglutide,0.25 or 0.5MG /DOS, (OZEMPIC, 0.25 OR 0.5 MG/DOSE,) 2 MG/1.5ML SOPN Prior authorization submitted via CoverMyMeds on 02/02/2021 PA submission pending

## 2021-03-02 ENCOUNTER — Other Ambulatory Visit: Payer: Self-pay | Admitting: Family Medicine

## 2021-03-02 NOTE — Telephone Encounter (Signed)
Patient requesting for new prescription for Ozempic 0.5mg  a week.

## 2021-03-02 NOTE — Telephone Encounter (Signed)
Medication refilled.  Please make sure that she has a follow-up scheduled in about 2 to 3 weeks.

## 2021-03-05 NOTE — Telephone Encounter (Signed)
Please schedule patient for appt

## 2021-03-05 NOTE — Telephone Encounter (Signed)
No, she just had labs so she does not need to be fasting, Thanks!

## 2021-03-05 NOTE — Telephone Encounter (Signed)
Pt notified of information below

## 2021-03-05 NOTE — Telephone Encounter (Signed)
LVM letting her know information below. AM

## 2021-03-05 NOTE — Telephone Encounter (Signed)
Patient has been scheduled for an appt on 04/09/21 and was wondering if this would include fasting bloodwork? Just so she knows if she has to be fasting.

## 2021-03-05 NOTE — Telephone Encounter (Signed)
Disregard. Mailbox was full, could not leave vm to let patient know no fasting labs.

## 2021-04-04 ENCOUNTER — Other Ambulatory Visit: Payer: Self-pay

## 2021-04-04 MED ORDER — SEMAGLUTIDE (1 MG/DOSE) 4 MG/3ML ~~LOC~~ SOPN: 1.0000 mg | PEN_INJECTOR | SUBCUTANEOUS | 0 refills | Status: DC

## 2021-04-04 NOTE — Telephone Encounter (Signed)
I did go ahead and send over prescription but she needs to schedule a follow-up in the next 2 to 3 weeks for weight management.  Meds ordered this encounter  Medications   Semaglutide, 1 MG/DOSE, 4 MG/3ML SOPN    Sig: Inject 1 mg as directed once a week.    Dispense:  3 mL    Refill:  0

## 2021-04-04 NOTE — Telephone Encounter (Signed)
Patient advised of message and verbalized understanding. Patient has appointment scheduled 05/02/21.

## 2021-04-04 NOTE — Telephone Encounter (Signed)
Patient requesting for a refill on Ozempic. Patient would like to increase dose on Ozempic. Forward to Dr. Madilyn Fireman.

## 2021-04-09 ENCOUNTER — Ambulatory Visit: Payer: 59 | Admitting: Family Medicine

## 2021-04-29 ENCOUNTER — Other Ambulatory Visit: Payer: Self-pay | Admitting: Family Medicine

## 2021-04-30 MED ORDER — SEMAGLUTIDE (1 MG/DOSE) 4 MG/3ML ~~LOC~~ SOPN: 1.0000 mg | PEN_INJECTOR | SUBCUTANEOUS | 0 refills | Status: DC

## 2021-05-02 ENCOUNTER — Encounter: Payer: Self-pay | Admitting: Family Medicine

## 2021-05-02 ENCOUNTER — Ambulatory Visit (INDEPENDENT_AMBULATORY_CARE_PROVIDER_SITE_OTHER): Payer: 59 | Admitting: Family Medicine

## 2021-05-02 ENCOUNTER — Other Ambulatory Visit: Payer: Self-pay

## 2021-05-02 VITALS — BP 101/78 | HR 75 | Resp 18 | Ht 66.0 in | Wt 208.0 lb

## 2021-05-02 DIAGNOSIS — Z7689 Persons encountering health services in other specified circumstances: Secondary | ICD-10-CM | POA: Diagnosis not present

## 2021-05-02 DIAGNOSIS — K2100 Gastro-esophageal reflux disease with esophagitis, without bleeding: Secondary | ICD-10-CM

## 2021-05-02 NOTE — Assessment & Plan Note (Addendum)
Visit #: 2  ?Starting Weight: 220 lbs  ? ?Current weight: 208 lbs  ?Previous weight: 220 lbs  ?Change in weight:Down 12 lbs  ?Goal weight: 175 lb ?Dietary goals: Continue to work on portion control and increasing vegetable intake. ?Exercise goals: Plan to start exercising regularly again. ?Medication: Ozempic 1 mg for now.  But consider increasing to 2 mg next month. ?Follow-up and referrals:8 weeks.  ? ?

## 2021-05-02 NOTE — Progress Notes (Signed)
? ?Established Patient Office Visit ? ?Subjective:  ?Patient ID: Valerie Shaw, female    DOB: [birth year]  Age: 36 y.o. MRN: [id number] ? ?CC:  ?Chief Complaint  ?Patient presents with  ? Weight Management   ?  Follow up on Ozempic. Previous weight 220. Current weight 208. Patient stated she has been having heartburn and feeling cold but overall is happy with Ozempic. Patient would like to discuss if she should stay on $Remov'1mg'sXceqH$  of Ozempic or if she should increase it.   ? ? ?HPI ?Elton Sin presents for  ? ?Follow up on Ozempic. Previous weight 220. Current weight 208. Patient stated she has been having heartburn and feeling cold but overall is happy with Ozempic.  Patient would like to discuss if she should stay on $Remov'1mg'nTydML$  of Ozempic or if she should increase it.  She says the whole family got COVID a few weeks ago and says she has not been exercising as regularly since then.  And now her daughter has lots of competitive ball games over the next few weeks but her plan is once that is overcome mid April that she is can to start back on a regular routine with exercise.  So she is mostly focusing on diet right now and trying to increase her vegetable intake.  She has noticed some increase in heartburn since being on the medication she says it has gotten better over the last week ? ?Past Medical History:  ?Diagnosis Date  ? Complication of anesthesia   ? have a hard time waking her up, felt hot, nausea and vomiting  ? Foot fracture   ? age 50  ? Headache   ? Infertility, female   ? clomid and stimulation meds in past; treated for years   ? Mucinous cystadenoma   ? PCOS (polycystic ovarian syndrome)   ? Supervision of high risk pregnancy, antepartum 02/23/2018  ?  Nursing Staff Provider Office Location  [place] Dating   2nd trimester Language   [facility]   Flu Vaccine  02/23/18  Genetic Screen  declines   TDaP vaccine    Hgb A1C or  GTT Early: 5.1 Third trimester  Rhogam   n/a   LAB RESULTS  Feeding Plan   breast/bottle Blood Type B/RH(D) POSITIVE/-- (01/13 1121) B+ Contraception  unsure Antibody NO ANTIBODIES DETECTED (01/13 1121) Circumcision  Rubel  ? Weight gain   ? ? ?Past Surgical History:  ?Procedure Laterality Date  ? ARTHROSCOPIC REPAIR ACL    ? CESAREAN SECTION    ? CESAREAN SECTION N/A 05/31/2018  ? Procedure: CESAREAN SECTION;  Surgeon: [name], MD;  Location: [facility];  Service: Obstetrics;  Laterality: N/A;  ? LAPAROSCOPIC BILATERAL SALPINGO OOPHERECTOMY N/A 06/19/2017  ? Procedure: EXPLORATORY LAPAROTOMY RIGHT SALPINGO OOPHORECTOMY;  Surgeon: Isabel Caprice, MD;  Location: [facility];  Service: Gynecology;  Laterality: N/A;  ? WISDOM TOOTH EXTRACTION    ? ? ?Family History  ?Problem Relation Age of Onset  ? Hypothyroidism Mother   ? Drug abuse Father   ? Hypertension Other   ? Cancer Paternal Uncle   ?     unkown primary  ? Lung cancer Maternal Grandfather   ?     smoker  ? ? ?Social History  ? ?Socioeconomic History  ? Marital status: Married  ?  Spouse name: Not on file  ? Number of children: Not on file  ? Years of education: Not on file  ? Highest education level:  Not on file  ?Occupational History  ? Not on file  ?Tobacco Use  ? Smoking status: Every Day  ?  Packs/day: 0.50  ?  Types: Cigarettes  ?  Last attempt to quit: 02/17/2018  ?  Years since quitting: 3.2  ? Smokeless tobacco: Never  ? Tobacco comments:  ?  3 cigarettes a day   ?Vaping Use  ? Vaping Use: Never used  ?Substance and Sexual Activity  ? Alcohol use: Not Currently  ?  Comment: 2 / month  ? Drug use: No  ? Sexual activity: Yes  ?  Birth control/protection: Coitus interruptus  ?Other Topics Concern  ? Not on file  ?Social History Narrative  ? Exercising some.   ? ?Social Determinants of Health  ? ?Financial Resource Strain: Not on file  ?Food Insecurity: Not on file  ?Transportation Needs: Not on file  ?Physical Activity: Not on file  ?Stress: Not on file  ?Social Connections: Not on file  ?Intimate Partner Violence: Not on file   ? ? ?Outpatient Medications Prior to Visit  ?Medication Sig Dispense Refill  ? Semaglutide, 1 MG/DOSE, 4 MG/3ML SOPN Inject 1 mg as directed once a week. 3 mL 0  ? ?No facility-administered medications prior to visit.  ? ? ?No Known Allergies ? ?ROS ?Review of Systems ? ?  ?Objective:  ?  ?Physical Exam ?Constitutional:   ?   Appearance: Normal appearance. She is well-developed.  ?HENT:  ?   Head: Normocephalic and atraumatic.  ?Cardiovascular:  ?   Rate and Rhythm: Normal rate and regular rhythm.  ?   Heart sounds: Normal heart sounds.  ?Pulmonary:  ?   Effort: Pulmonary effort is normal.  ?   Breath sounds: Normal breath sounds.  ?Skin: ?   General: Skin is warm and dry.  ?Neurological:  ?   Mental Status: She is alert and oriented to person, place, and time.  ?Psychiatric:     ?   Behavior: Behavior normal.  ? ? ?BP 101/78 (BP Location: Left Arm)   Pulse 75   Resp 18   Ht $R'5\' 6"'Mb$  (1.676 m)   Wt 208 lb (94.3 kg)   SpO2 99%   BMI 33.57 kg/m?  ?Wt Readings from Last 3 Encounters:  ?05/02/21 208 lb (94.3 kg)  ?01/23/21 220 lb (99.8 kg)  ?12/22/20 220 lb 1.3 oz (99.8 kg)  ? ? ? ?There are no preventive care reminders to display for this patient. ? ? ?There are no preventive care reminders to display for this patient. ? ?Lab Results  ?Component Value Date  ? TSH 1.19 01/21/2020  ? ?Lab Results  ?Component Value Date  ? WBC 6.7 01/23/2021  ? HGB 14.6 01/23/2021  ? HCT 43.2 01/23/2021  ? MCV 89.3 01/23/2021  ? PLT 277 01/23/2021  ? ?Lab Results  ?Component Value Date  ? NA 138 01/23/2021  ? K 4.3 01/23/2021  ? CO2 25 01/23/2021  ? GLUCOSE 87 01/23/2021  ? BUN 17 01/23/2021  ? CREATININE 0.90 01/23/2021  ? BILITOT 0.7 01/23/2021  ? ALKPHOS 75 06/18/2017  ? AST 15 01/23/2021  ? ALT 7 01/23/2021  ? PROT 7.5 01/23/2021  ? ALBUMIN 4.1 06/18/2017  ? CALCIUM 10.2 01/23/2021  ? ANIONGAP 9 06/20/2017  ? EGFR 85 01/23/2021  ? ?Lab Results  ?Component Value Date  ? CHOL 180 01/23/2021  ? ?Lab Results  ?Component Value Date  ?  HDL 44 (L) 01/23/2021  ? ?Lab Results  ?Component Value Date  ?  LDLCALC 113 (H) 01/23/2021  ? ?Lab Results  ?Component Value Date  ? TRIG 124 01/23/2021  ? ?Lab Results  ?Component Value Date  ? CHOLHDL 4.1 01/23/2021  ? ?Lab Results  ?Component Value Date  ? HGBA1C 5.1 02/23/2018  ? ? ?  ?Assessment & Plan:  ? ?Problem List Items Addressed This Visit   ? ?  ? Other  ? Encounter for weight management - Primary  ?  Visit #: 2  ?Starting Weight: 220 lbs  ? ?Current weight: 208 lbs  ?Previous weight: 220 lbs  ?Change in weight:Down 12 lbs  ?Goal weight: 175 lb ?Dietary goals: Continue to work on portion control and increasing vegetable intake. ?Exercise goals: Plan to start exercising regularly again. ?Medication: Ozempic 1 mg for now.  But consider increasing to 2 mg next month. ?Follow-up and referrals:8 weeks.  ? ?  ?  ? ?Other Visit Diagnoses   ? ? Gastroesophageal reflux disease with esophagitis without hemorrhage      ? ?  ? ?GERD -discussed cutting back on greasy foods, spicy foods, acidic foods like tomatoes and carbonated beverages.  Okay to use Tums as needed or even start a Prilosec or Nexium over-the-counter for short period of time if needed.  Suspect it probably is secondary to the GLP-1 causing some gut slowness.  She is also noticed a little bit of constipation as well and has had to take something twice to keep her bowels moving so just encouraged to keep a real close eye on that and be proactive with that as well.  Constipation is definitely a side effect of the GLP-1's. ? ?No orders of the defined types were placed in this encounter. ? ? ?Follow-up: Return in about 2 months (around 07/02/2021) for Weight Mgt.  ? ? ?Beatrice Lecher, MD ?

## 2021-05-31 ENCOUNTER — Other Ambulatory Visit: Payer: Self-pay | Admitting: Family Medicine

## 2021-06-01 ENCOUNTER — Telehealth: Payer: Self-pay | Admitting: *Deleted

## 2021-06-01 MED ORDER — SEMAGLUTIDE (2 MG/DOSE) 8 MG/3ML ~~LOC~~ SOPN: 2.0000 mg | PEN_INJECTOR | SUBCUTANEOUS | 0 refills | Status: DC

## 2021-06-01 NOTE — Telephone Encounter (Signed)
Meds ordered this encounter  ?Medications  ? Semaglutide, 2 MG/DOSE, 8 MG/3ML SOPN  ?  Sig: Inject 2 mg as directed once a week.  ?  Dispense:  3 mL  ?  Refill:  0  ? ? ?

## 2021-06-01 NOTE — Telephone Encounter (Signed)
Pt called and stated that she was told by the pharmacy that the [facility] [place] will need a PA.  ? ?I will fwd this information to Ihor Dow. CMA for review.  ?

## 2021-06-04 NOTE — Telephone Encounter (Signed)
PA for Ozempic has been approved.  ? ? ?This request has been approved using information available on the patient's profile. [id number];HLKTGY:[id number];Review Type:Prior Auth;Coverage Start Date:05/05/2021;Coverage End Date:06/04/2022; ? ?Key [id number] ? ? ?Patient is aware.  ?

## 2021-06-27 ENCOUNTER — Other Ambulatory Visit: Payer: Self-pay | Admitting: Family Medicine

## 2021-06-27 MED ORDER — SEMAGLUTIDE (2 MG/DOSE) 8 MG/3ML ~~LOC~~ SOPN: 2.0000 mg | PEN_INJECTOR | SUBCUTANEOUS | 0 refills | Status: DC

## 2021-07-04 ENCOUNTER — Ambulatory Visit: Payer: 59 | Admitting: Family Medicine

## 2021-07-11 ENCOUNTER — Telehealth: Payer: 59 | Admitting: Physician Assistant

## 2021-07-11 ENCOUNTER — Ambulatory Visit: Payer: 59

## 2021-07-11 DIAGNOSIS — R3989 Other symptoms and signs involving the genitourinary system: Secondary | ICD-10-CM | POA: Diagnosis not present

## 2021-07-11 MED ORDER — CEPHALEXIN 500 MG PO CAPS: 500.0000 mg | ORAL_CAPSULE | Freq: Two times a day (BID) | ORAL | 0 refills | Status: AC

## 2021-07-11 NOTE — Progress Notes (Signed)
Virtual Visit Consent   Valerie Shaw, you are scheduled for a virtual visit with a [facility] provider today. Just as with appointments in the office, your consent must be obtained to participate. Your consent will be active for this visit and any virtual visit you may have with one of our providers in the next 365 days. If you have a MyChart account, a copy of this consent can be sent to you electronically.  As this is a virtual visit, video technology does not allow for your provider to perform a traditional examination. This may limit your provider's ability to fully assess your condition. If your provider identifies any concerns that need to be evaluated in person or the need to arrange testing (such as labs, EKG, etc.), we will make arrangements to do so. Although advances in technology are sophisticated, we cannot ensure that it will always work on either your end or our end. If the connection with a video visit is poor, the visit may have to be switched to a telephone visit. With either a video or telephone visit, we are not always able to ensure that we have a secure connection.  By engaging in this virtual visit, you consent to the provision of healthcare and authorize for your insurance to be billed (if applicable) for the services provided during this visit. Depending on your insurance coverage, you may receive a charge related to this service.  I need to obtain your verbal consent now. Are you willing to proceed with your visit today? Valerie Shaw has provided verbal consent on 07/11/2021 for a virtual visit (video or telephone). Leeanne Rio, [place]  Date: 07/11/2021 1:15 PM  Virtual Visit via Video Note   I, Leeanne Rio, connected with  Valerie Shaw  ([id number], 06/13/1985) on 07/11/21 at  1:15 PM EDT by a video-enabled telemedicine application and verified that I am speaking with the correct person using two identifiers.  Location: Patient: Virtual Visit  Location Patient: Home Provider: Virtual Visit Location Provider: Home Office   I discussed the limitations of evaluation and management by telemedicine and the availability of in person appointments. The patient expressed understanding and agreed to proceed.    History of Present Illness: Valerie Shaw is a 36 y.o. who identifies as a female who was assigned female at birth, and is being seen today for possible UTI.  Endorses history of UTI when younger and this seems identical to previous infections. Notes over past week or so with urinary urgency, frequency, incomplete bladder emptying. Some occasional dysuria. Has tried increased hydration, natural remedies and OTC Azo which help but do not fully resolve symptoms. Denies fever, nausea/vomiting or abdominal pain. LMP was 5 weeks ago. Denies concern for pregnancy.   HPI: HPI  Problems:  Patient Active Problem List   Diagnosis Date Noted   Encounter for weight management 05/02/2021   Left ear pain 12/11/2020   PCOS (polycystic ovarian syndrome) 01/18/2019   H/O unilateral oophorectomy 05/22/2018   Mucinous cystadenoma    BMI 39.0-39.9,adult 12/16/2016    Allergies: No Known Allergies Medications:  Current Outpatient Medications:    Semaglutide, 2 MG/DOSE, 8 MG/3ML SOPN, Inject 2 mg as directed once a week., Disp: 3 mL, Rfl: 0  Observations/Objective: Patient is well-developed, well-nourished in no acute distress.  Resting comfortably at home.  Head is normocephalic, atraumatic.  No labored breathing. Speech is clear and coherent with logical content.  Patient is alert and oriented at baseline.  Assessment and Plan: 1. Suspected UTI  Classic symptoms. No alarm signs/symptoms present. Will start treatment with Keflex 500 mg BID x 7 days. Supportive measures and OTC medications reviewed. Follow-up with PCP in person if not resolving or any new/worsening symptoms despite treatment.    Follow Up Instructions: I discussed the  assessment and treatment plan with the patient. The patient was provided an opportunity to ask questions and all were answered. The patient agreed with the plan and demonstrated an understanding of the instructions.  A copy of instructions were sent to the patient via MyChart unless otherwise noted below.   The patient was advised to call back or seek an in-person evaluation if the symptoms worsen or if the condition fails to improve as anticipated.  Time:  I spent 10 minutes with the patient via telehealth technology discussing the above problems/concerns.    Leeanne Rio, PA-C

## 2021-07-11 NOTE — Patient Instructions (Signed)
Valerie Shaw, thank you for joining Leeanne Rio, PA-C for today's virtual visit.  While this provider is not your primary care provider (PCP), if your PCP is located in our provider database this encounter information will be shared with them immediately following your visit.  Consent: (Patient) Valerie Shaw provided verbal consent for this virtual visit at the beginning of the encounter.  Current Medications:  Current Outpatient Medications:    Semaglutide, 2 MG/DOSE, 8 MG/3ML SOPN, Inject 2 mg as directed once a week., Disp: 3 mL, Rfl: 0   Medications ordered in this encounter:  No orders of the defined types were placed in this encounter.    *If you need refills on other medications prior to your next appointment, please contact your pharmacy*  Follow-Up: Call back or seek an in-person evaluation if the symptoms worsen or if the condition fails to improve as anticipated.  Other Instructions Your symptoms are consistent with a bladder infection, also called acute cystitis. Please take your antibiotic (Keflex) as directed until all pills are gone.  Stay very well hydrated.  Consider a daily probiotic (Align, Culturelle, or Activia) to help prevent stomach upset caused by the antibiotic.  Taking a probiotic daily may also help prevent recurrent UTIs.  Also consider taking AZO (Phenazopyridine) tablets to help decrease pain with urination.   Urinary Tract Infection A urinary tract infection (UTI) can occur any place along the urinary tract. The tract includes the kidneys, ureters, bladder, and urethra. A type of germ called bacteria often causes a UTI. UTIs are often helped with antibiotic medicine.  HOME CARE  If given, take antibiotics as told by your doctor. Finish them even if you start to feel better. Drink enough fluids to keep your pee (urine) clear or pale yellow. Avoid tea, drinks with caffeine, and bubbly (carbonated) drinks. Pee often. Avoid holding your pee  in for a long time. Pee before and after having sex (intercourse). Wipe from front to back after you poop (bowel movement) if you are a woman. Use each tissue only once. GET HELP RIGHT AWAY IF:  You have back pain. You have lower belly (abdominal) pain. You have chills. You feel sick to your stomach (nauseous). You throw up (vomit). Your burning or discomfort with peeing does not go away. You have a fever. Your symptoms are not better in 3 days. MAKE SURE YOU:  Understand these instructions. Will watch your condition. Will get help right away if you are not doing well or get worse. Document Released: 07/17/2007 Document Revised: 10/23/2011 Document Reviewed: 08/29/2011 [facility] Patient Information 2015 [place], [place]. This information is not intended to replace advice given to you by your health care provider. Make sure you discuss any questions you have with your health care provider.    If you have been instructed to have an in-person evaluation today at a local Urgent Care facility, please use the link below. It will take you to a list of all of our available [facility], including address, phone number and hours of operation. Please do not delay care.  [facility]  If you or a family member do not have a primary care provider, use the link below to schedule a visit and establish care. When you choose a [facility] primary care physician or advanced practice provider, you gain a long-term partner in health. Find a Primary Care Provider  Learn more about [facility]'s in-office and virtual care options: [facility] Now

## 2021-07-25 ENCOUNTER — Other Ambulatory Visit: Payer: Self-pay | Admitting: Family Medicine

## 2021-07-27 ENCOUNTER — Other Ambulatory Visit: Payer: Self-pay | Admitting: Family Medicine

## 2021-07-27 ENCOUNTER — Encounter: Payer: Self-pay | Admitting: Family Medicine

## 2021-07-30 ENCOUNTER — Other Ambulatory Visit: Payer: Self-pay | Admitting: *Deleted

## 2021-08-21 ENCOUNTER — Other Ambulatory Visit: Payer: Self-pay | Admitting: Family Medicine

## 2021-09-03 ENCOUNTER — Other Ambulatory Visit: Payer: Self-pay | Admitting: Family Medicine

## 2021-09-03 MED ORDER — OZEMPIC (2 MG/DOSE) 8 MG/3ML ~~LOC~~ SOPN: 2.0000 mg | PEN_INJECTOR | SUBCUTANEOUS | 0 refills | Status: DC

## 2021-09-25 ENCOUNTER — Ambulatory Visit: Payer: 59 | Admitting: Family Medicine

## 2021-10-01 IMAGING — US US PELVIS COMPLETE WITH TRANSVAGINAL
1 series · 14 of 25 positions shown · non-contrast
Comparison: None

CLINICAL DATA: IUD placement

EXAM:
TRANSABDOMINAL AND TRANSVAGINAL ULTRASOUND OF PELVIS
TECHNIQUE: Both transabdominal and transvaginal ultrasound examinations of the
pelvis were performed. Transabdominal technique was performed for
global imaging of the pelvis including uterus, ovaries, adnexal
regions, and pelvic cul-de-sac. It was necessary to proceed with
endovaginal exam following the transabdominal exam to visualize the
uterus endometrium ovaries.

[Series 1: us pelvis complete with transvaginal · 0.27mm/px · 105 acquisitions, 14 frames shown]
[im 1/105]
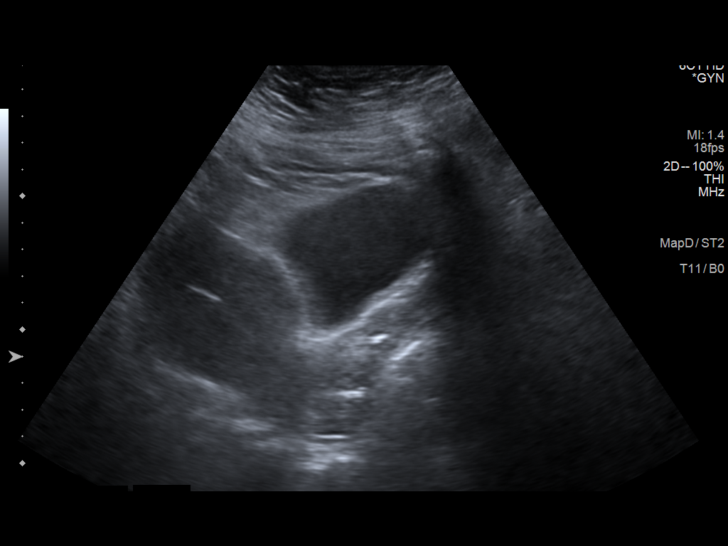
[im 9/105]
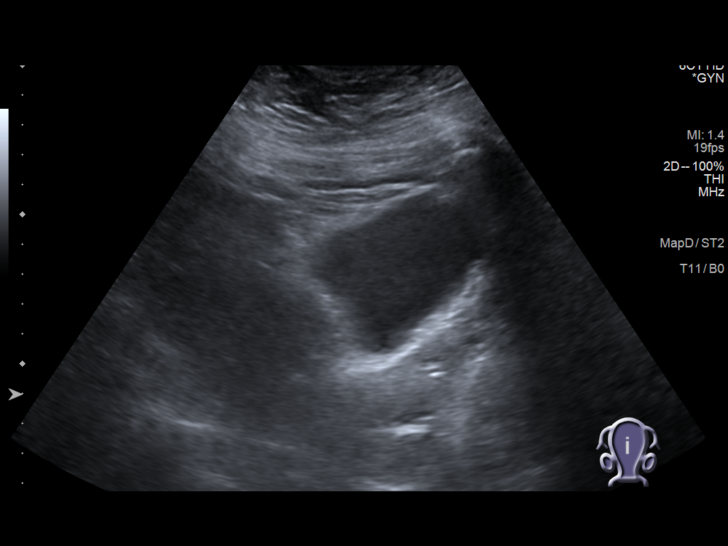
[im 18/105]
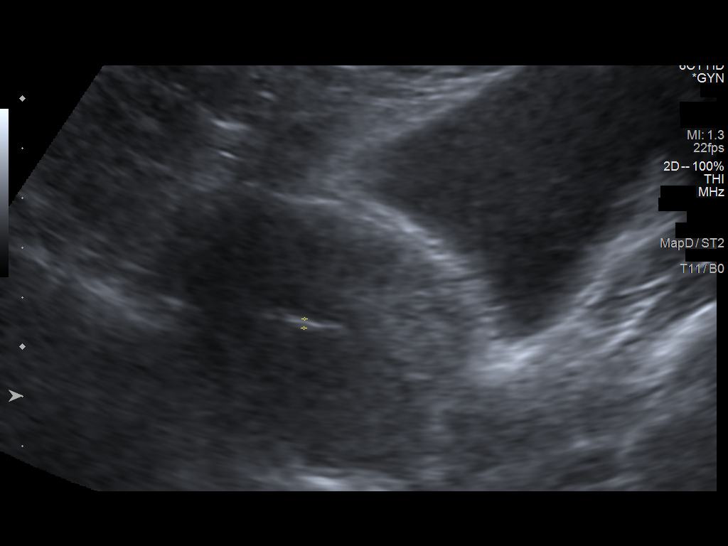
[im 27/105]
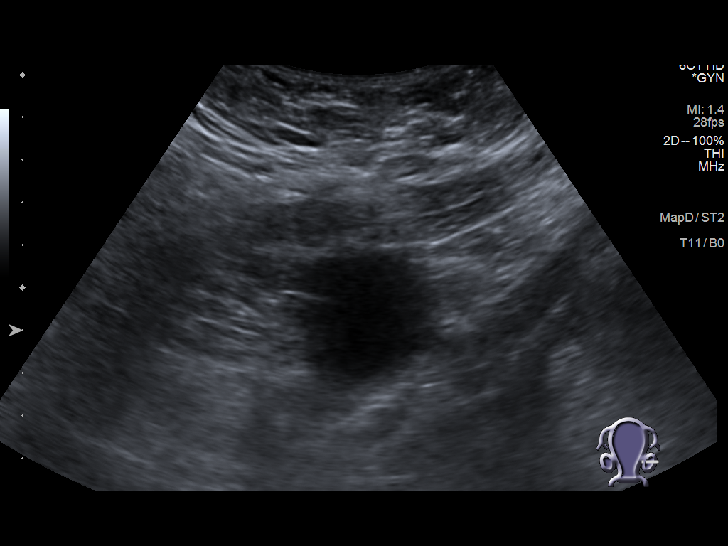
[im 35/105]
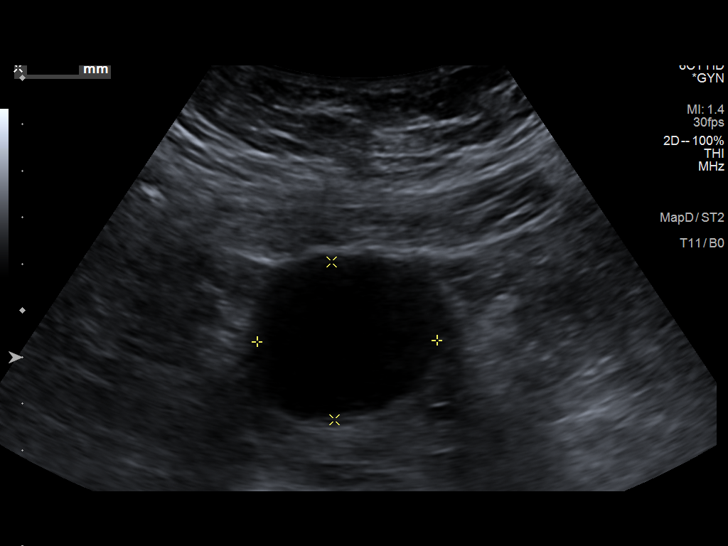
[im 40/105]
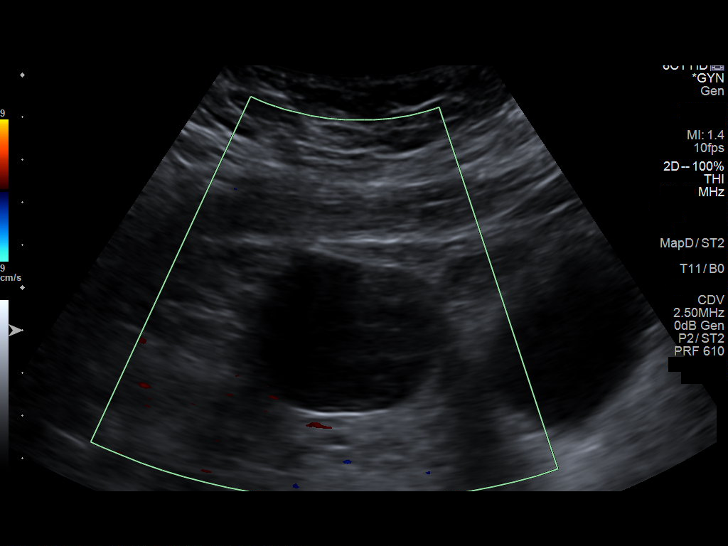
[im 48/105]
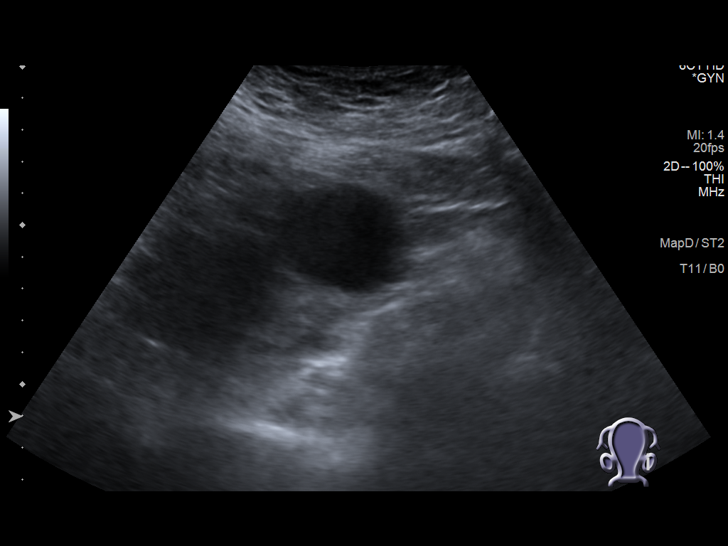
[im 57/105]
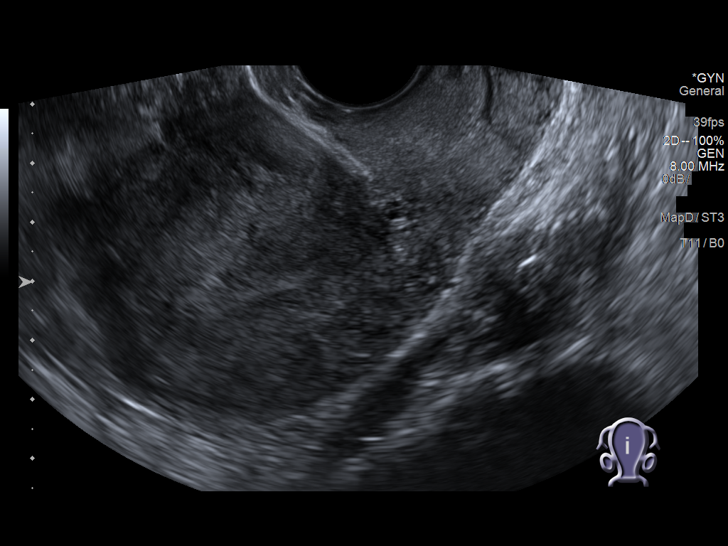
[im 66/105]
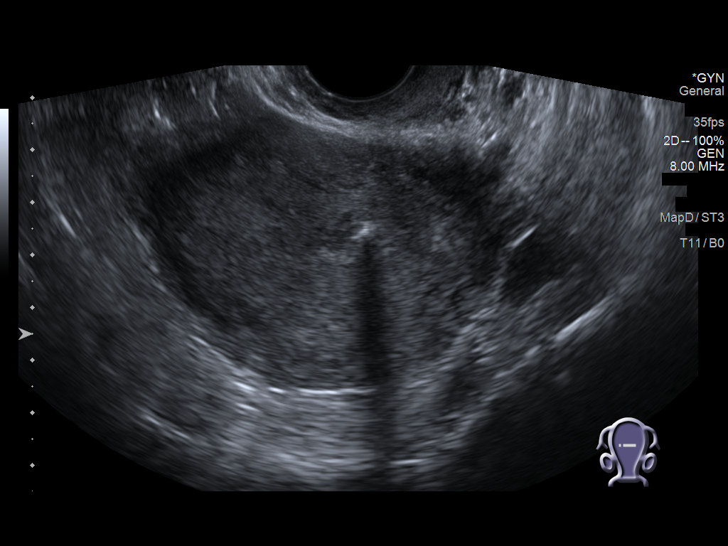
[im 70/105]
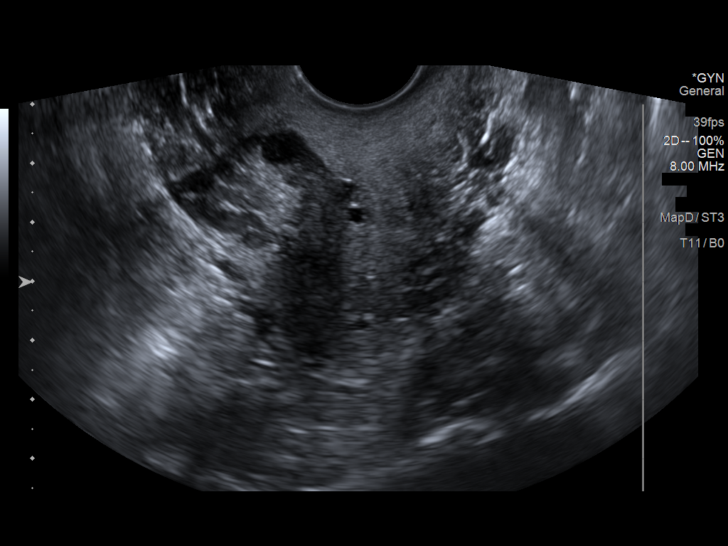
[im 79/105]
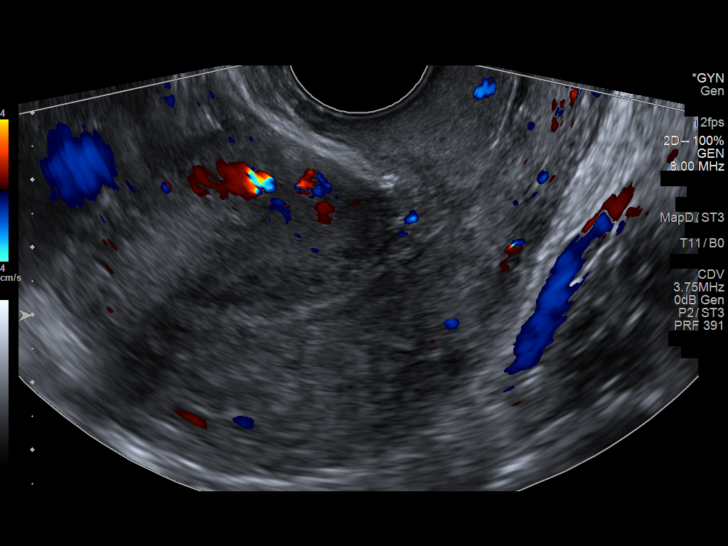
[im 87/105]
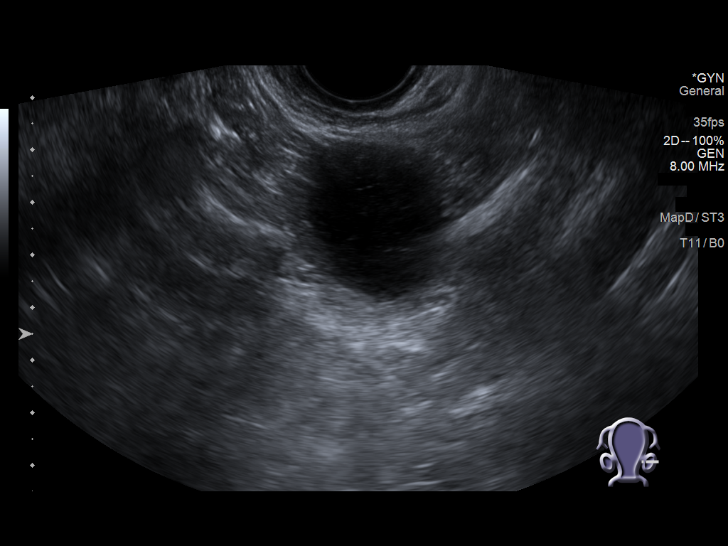
[im 96/105]
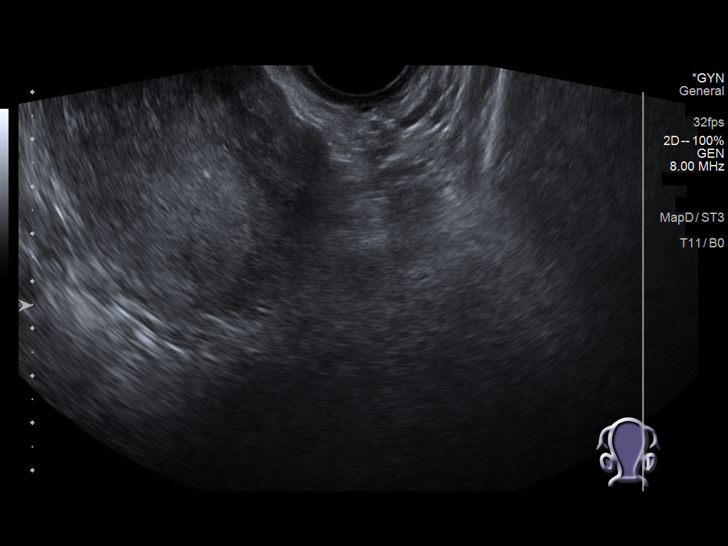
[im 105/105]
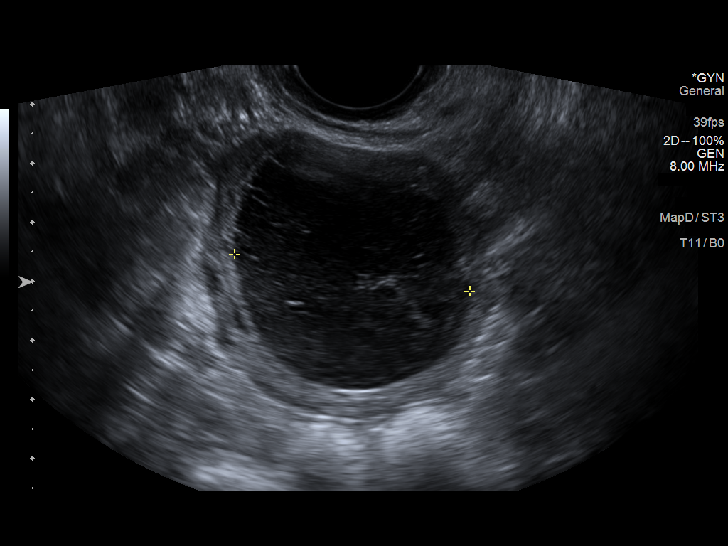

[14 of 25 positions shown; findings below may reference images not displayed]

FINDINGS: Uterus

Measurements: 10.4 x 5.7 x 7.1 cm = volume: 220 mL. No fibroids or
other mass visualized.

Endometrium

Thickness: 5.6 mm.  IUD within the uterus

Right ovary

Nonvisualized history of right oophorectomy

Left ovary

Measurements: 6 x 5 x 4.9 cm = volume: 76 mL. Complex cyst in the
left ovary measuring 4 x 3.9 x 4 cm.

Other findings

No abnormal free fluid.
IMPRESSION: 1. IUD appears grossly appropriate in position by sonography.
2. 4 cm indeterminate but probably benign cyst in the left ovary,
recommend ultrasound follow-up in 6-12 weeks.

## 2021-10-09 ENCOUNTER — Ambulatory Visit: Payer: 59 | Admitting: Family Medicine

## 2021-11-01 ENCOUNTER — Ambulatory Visit (INDEPENDENT_AMBULATORY_CARE_PROVIDER_SITE_OTHER): Payer: 59 | Admitting: Family Medicine

## 2021-11-01 ENCOUNTER — Encounter: Payer: Self-pay | Admitting: Family Medicine

## 2021-11-01 VITALS — BP 128/87 | HR 74 | Ht 66.0 in | Wt 192.0 lb

## 2021-11-01 DIAGNOSIS — Z7689 Persons encountering health services in other specified circumstances: Secondary | ICD-10-CM | POA: Diagnosis not present

## 2021-11-01 MED ORDER — OZEMPIC (2 MG/DOSE) 8 MG/3ML ~~LOC~~ SOPN: 2.0000 mg | PEN_INJECTOR | SUBCUTANEOUS | 1 refills | Status: DC

## 2021-11-01 NOTE — Assessment & Plan Note (Addendum)
Visit #: 3  Starting Weight: 220 lbs   Current weight: 192 lbs  Previous weight: 208 lbs  Change in weight:Down 16 lbs  Goal weight: 175 lb Dietary goals: Continue to work on portion control and increasing vegetable intake. Exercise goals:  Working out 2 days/week with cardio and some resistance training.  Urged her to just keep going with it and to have a backup plan when the winter rolls around.  She says she will likely join a local gym around that time. Medication: Ozempic 2 mg weekly.. Follow-up and referrals: 4 months.

## 2021-11-01 NOTE — Progress Notes (Signed)
Established Patient Office Visit  Subjective   Patient ID: Valerie Shaw, female    DOB: 06-06-1985  Age: 36 y.o. MRN: [id number]  Chief Complaint  Patient presents with   Weight Check    HPI  Follow-up weight management.  I last saw her about 6 months ago.  At that time she was on semaglutide 1 mg.  Plan was to start exercising.  She was down to 208 pounds.  She is actually doing really well she is now on the 2 mg dose.  Weight is down to 192 pounds so she is down another 16 pounds since I last saw her.  She feels like the weight loss has been slow but very consistent.  She has been exercising twice a week by going to the local park and doing walking intervals, lunges etc. for about 45 minutes.  She is been doing it pretty consistently for about the last 3 weeks.  She continues to work on diet and portion control.  She is been very happy with the medication and the results.  She occasionally will get a little bit of constipation right after giving the injection but she feels like it is manageable by increasing her fiber.  She just feels better physically overall and feels like her energy levels are much better.    ROS    Objective:     BP 128/87   Pulse 74   Ht '5\' 6"'$  (1.676 m)   Wt 192 lb (87.1 kg)   SpO2 100%   BMI 30.99 kg/m    Physical Exam Vitals and nursing note reviewed.  Constitutional:      Appearance: She is well-developed.  HENT:     Head: Normocephalic and atraumatic.  Cardiovascular:     Rate and Rhythm: Normal rate and regular rhythm.     Heart sounds: Normal heart sounds.  Pulmonary:     Effort: Pulmonary effort is normal.     Breath sounds: Normal breath sounds.  Skin:    General: Skin is warm and dry.  Neurological:     Mental Status: She is alert and oriented to person, place, and time.  Psychiatric:        Behavior: Behavior normal.      No results found for any visits on 11/01/21.    The ASCVD Risk score (Arnett DK, et al., 2019)  failed to calculate for the following reasons:   The 2019 ASCVD risk score is only valid for ages 5 to 72    Assessment & Plan:   Problem List Items Addressed This Visit       Other   Encounter for weight management - Primary    Visit #: 3  Starting Weight: 220 lbs    Current weight: 192 lbs  Previous weight: 208 lbs  Change in weight:Down 16 lbs  Goal weight: 175 lb Dietary goals: Continue to work on portion control and increasing vegetable intake. Exercise goals:  Working out 2 days/week with cardio and some resistance training.  Urged her to just keep going with it and to have a backup plan when the winter rolls around.  She says she will likely join a local gym around that time. Medication: Ozempic 2 mg weekly.. Follow-up and referrals: 4 months.         Relevant Medications   Semaglutide, 2 MG/DOSE, (OZEMPIC, 2 MG/DOSE,) 8 MG/3ML SOPN    Return in about 4 months (around 03/03/2022) for Weight managment .    Barnetta Chapel  Madilyn Fireman, MD

## 2021-11-28 ENCOUNTER — Telehealth: Payer: Self-pay

## 2021-11-28 DIAGNOSIS — Z7689 Persons encountering health services in other specified circumstances: Secondary | ICD-10-CM

## 2021-11-28 MED ORDER — OZEMPIC (2 MG/DOSE) 8 MG/3ML ~~LOC~~ SOPN: 2.0000 mg | PEN_INJECTOR | SUBCUTANEOUS | 1 refills | Status: DC

## 2021-11-28 NOTE — Telephone Encounter (Signed)
Pt called and left v for a refill on her ozempic

## 2021-12-28 ENCOUNTER — Telehealth: Payer: 59 | Admitting: Physician Assistant

## 2021-12-28 DIAGNOSIS — R3989 Other symptoms and signs involving the genitourinary system: Secondary | ICD-10-CM

## 2021-12-28 MED ORDER — CEPHALEXIN 500 MG PO CAPS: 500.0000 mg | ORAL_CAPSULE | Freq: Two times a day (BID) | ORAL | 0 refills | Status: DC

## 2021-12-28 NOTE — Progress Notes (Signed)
Virtual Visit Consent   Valerie Shaw, you are scheduled for a virtual visit with a [facility] provider today. Just as with appointments in the office, your consent must be obtained to participate. Your consent will be active for this visit and any virtual visit you may have with one of our providers in the next 365 days. If you have a MyChart account, a copy of this consent can be sent to you electronically.  As this is a virtual visit, video technology does not allow for your provider to perform a traditional examination. This may limit your provider's ability to fully assess your condition. If your provider identifies any concerns that need to be evaluated in person or the need to arrange testing (such as labs, EKG, etc.), we will make arrangements to do so. Although advances in technology are sophisticated, we cannot ensure that it will always work on either your end or our end. If the connection with a video visit is poor, the visit may have to be switched to a telephone visit. With either a video or telephone visit, we are not always able to ensure that we have a secure connection.  By engaging in this virtual visit, you consent to the provision of healthcare and authorize for your insurance to be billed (if applicable) for the services provided during this visit. Depending on your insurance coverage, you may receive a charge related to this service.  I need to obtain your verbal consent now. Are you willing to proceed with your visit today? Valerie Shaw has provided verbal consent on 12/28/2021 for a virtual visit (video or telephone). Mar Daring, PA-C  Date: 12/28/2021 4:38 PM  Virtual Visit via Video Note   I, Mar Daring, connected with  Valerie Shaw  ([id number], 09/08/1985) on 12/28/21 at  4:30 PM EST by a video-enabled telemedicine application and verified that I am speaking with the correct person using two identifiers.  Location: Patient: Virtual Visit  Location Patient: Home Provider: Virtual Visit Location Provider: Home Office   I discussed the limitations of evaluation and management by telemedicine and the availability of in person appointments. The patient expressed understanding and agreed to proceed.    History of Present Illness: Valerie Shaw is a 36 y.o. who identifies as a female who was assigned female at birth, and is being seen today for possible UTI.  HPI: Urinary Tract Infection  This is a new problem. The current episode started 1 to 4 weeks ago (10 days). The problem occurs every urination. The problem has been gradually worsening. The quality of the pain is described as aching and burning. The patient is experiencing no pain. There has been no fever. Associated symptoms include frequency, hesitancy and urgency. Pertinent negatives include no chills, flank pain or hematuria. She has tried increased fluids for the symptoms. The treatment provided no relief. There is no history of recurrent UTIs.     Problems:  Patient Active Problem List   Diagnosis Date Noted   Encounter for weight management 05/02/2021   Left ear pain 12/11/2020   PCOS (polycystic ovarian syndrome) 01/18/2019   H/O unilateral oophorectomy 05/22/2018   Mucinous cystadenoma    BMI 39.0-39.9,adult 12/16/2016    Allergies: No Known Allergies Medications:  Current Outpatient Medications:    cephALEXin (KEFLEX) 500 MG capsule, Take 1 capsule (500 mg total) by mouth 2 (two) times daily., Disp: 14 capsule, Rfl: 0   Semaglutide, 2 MG/DOSE, (OZEMPIC, 2 MG/DOSE,) 8 MG/3ML SOPN,  Inject 2 mg into the skin once a week., Disp: 9 mL, Rfl: 1  Observations/Objective: Patient is well-developed, well-nourished in no acute distress.  Resting comfortably at home.  Head is normocephalic, atraumatic.  No labored breathing.  Speech is clear and coherent with logical content.  Patient is alert and oriented at baseline.    Assessment and Plan: 1. Suspected UTI -  cephALEXin (KEFLEX) 500 MG capsule; Take 1 capsule (500 mg total) by mouth 2 (two) times daily.  Dispense: 14 capsule; Refill: 0  - Worsening symptoms.  - Will treat empirically with Keflex - May use AZO for bladder spasms - Continue to push fluids.  - Seek in person evaluation for urine culture if symptoms do not improve or if they worsen.    Follow Up Instructions: I discussed the assessment and treatment plan with the patient. The patient was provided an opportunity to ask questions and all were answered. The patient agreed with the plan and demonstrated an understanding of the instructions.  A copy of instructions were sent to the patient via MyChart unless otherwise noted below.    The patient was advised to call back or seek an in-person evaluation if the symptoms worsen or if the condition fails to improve as anticipated.  Time:  I spent 12 minutes with the patient via telehealth technology discussing the above problems/concerns.    Mar Daring, PA-C

## 2021-12-28 NOTE — Patient Instructions (Signed)
Elton Sin, thank you for joining Mar Daring, PA-C for today's virtual visit.  While this provider is not your primary care provider (PCP), if your PCP is located in our provider database this encounter information will be shared with them immediately following your visit.   [facility] account gives you access to today's visit and all your visits, tests, and labs performed at [facility] " click here if you don't have a [facility] account or go to [email or web address]  Consent: (Patient) Valerie Shaw provided verbal consent for this virtual visit at the beginning of the encounter.  Current Medications:  Current Outpatient Medications:    cephALEXin (KEFLEX) 500 MG capsule, Take 1 capsule (500 mg total) by mouth 2 (two) times daily., Disp: 14 capsule, Rfl: 0   Semaglutide, 2 MG/DOSE, (OZEMPIC, 2 MG/DOSE,) 8 MG/3ML SOPN, Inject 2 mg into the skin once a week., Disp: 9 mL, Rfl: 1   Medications ordered in this encounter:  Meds ordered this encounter  Medications   cephALEXin (KEFLEX) 500 MG capsule    Sig: Take 1 capsule (500 mg total) by mouth 2 (two) times daily.    Dispense:  14 capsule    Refill:  0    Order Specific Question:   Supervising Provider    Answer:   Chase Picket [id number]     *If you need refills on other medications prior to your next appointment, please contact your pharmacy*  Follow-Up: Call back or seek an in-person evaluation if the symptoms worsen or if the condition fails to improve as anticipated.  [place] [phone]  Other Instructions Urinary Tract Infection, Adult  A urinary tract infection (UTI) is an infection of any part of the urinary tract. The urinary tract includes the kidneys, ureters, bladder, and urethra. These organs make, store, and get rid of urine in the body. An upper UTI affects the ureters and kidneys. A lower UTI affects the bladder and urethra. What are  the causes? Most urinary tract infections are caused by bacteria in your genital area around your urethra, where urine leaves your body. These bacteria grow and cause inflammation of your urinary tract. What increases the risk? You are more likely to develop this condition if: You have a urinary catheter that stays in place. You are not able to control when you urinate or have a bowel movement (incontinence). You are female and you: Use a spermicide or diaphragm for birth control. Have low estrogen levels. Are pregnant. You have certain genes that increase your risk. You are sexually active. You take antibiotic medicines. You have a condition that causes your flow of urine to slow down, such as: An enlarged prostate, if you are female. Blockage in your urethra. A kidney stone. A nerve condition that affects your bladder control (neurogenic bladder). Not getting enough to drink, or not urinating often. You have certain medical conditions, such as: Diabetes. A weak disease-fighting system (immunesystem). Sickle cell disease. Gout. Spinal cord injury. What are the signs or symptoms? Symptoms of this condition include: Needing to urinate right away (urgency). Frequent urination. This may include small amounts of urine each time you urinate. Pain or burning with urination. Blood in the urine. Urine that smells bad or unusual. Trouble urinating. Cloudy urine. Vaginal discharge, if you are female. Pain in the abdomen or the lower back. You may also have: Vomiting or a decreased appetite. Confusion. Irritability or tiredness. A fever or chills. Diarrhea. The  first symptom in older adults may be confusion. In some cases, they may not have any symptoms until the infection has worsened. How is this diagnosed? This condition is diagnosed based on your medical history and a physical exam. You may also have other tests, including: Urine tests. Blood tests. Tests for STIs (sexually  transmitted infections). If you have had more than one UTI, a cystoscopy or imaging studies may be done to determine the cause of the infections. How is this treated? Treatment for this condition includes: Antibiotic medicine. Over-the-counter medicines to treat discomfort. Drinking enough water to stay hydrated. If you have frequent infections or have other conditions such as a kidney stone, you may need to see a health care provider who specializes in the urinary tract (urologist). In rare cases, urinary tract infections can cause sepsis. Sepsis is a life-threatening condition that occurs when the body responds to an infection. Sepsis is treated in the hospital with IV antibiotics, fluids, and other medicines. Follow these instructions at home:  Medicines Take over-the-counter and prescription medicines only as told by your health care provider. If you were prescribed an antibiotic medicine, take it as told by your health care provider. Do not stop using the antibiotic even if you start to feel better. General instructions Make sure you: Empty your bladder often and completely. Do not hold urine for long periods of time. Empty your bladder after sex. Wipe from front to back after urinating or having a bowel movement if you are female. Use each tissue only one time when you wipe. Drink enough fluid to keep your urine pale yellow. Keep all follow-up visits. This is important. Contact a health care provider if: Your symptoms do not get better after 1-2 days. Your symptoms go away and then return. Get help right away if: You have severe pain in your back or your lower abdomen. You have a fever or chills. You have nausea or vomiting. Summary A urinary tract infection (UTI) is an infection of any part of the urinary tract, which includes the kidneys, ureters, bladder, and urethra. Most urinary tract infections are caused by bacteria in your genital area. Treatment for this condition often  includes antibiotic medicines. If you were prescribed an antibiotic medicine, take it as told by your health care provider. Do not stop using the antibiotic even if you start to feel better. Keep all follow-up visits. This is important. This information is not intended to replace advice given to you by your health care provider. Make sure you discuss any questions you have with your health care provider. Document Revised: 09/10/2019 Document Reviewed: 09/10/2019 Elsevier Patient Education  [place].    If you have been instructed to have an in-person evaluation today at a local Urgent Care facility, please use the link below. It will take you to a list of all of our available [facility], including address, phone number and hours of operation. Please do not delay care.  [facility]  If you or a family member do not have a primary care provider, use the link below to schedule a visit and establish care. When you choose a [facility] primary care physician or advanced practice provider, you gain a long-term partner in health. Find a Primary Care Provider  Learn more about 's in-office and virtual care options: [facility]

## 2022-01-16 ENCOUNTER — Ambulatory Visit (INDEPENDENT_AMBULATORY_CARE_PROVIDER_SITE_OTHER): Payer: Managed Care, Other (non HMO) | Admitting: Family Medicine

## 2022-01-16 ENCOUNTER — Encounter: Payer: Self-pay | Admitting: Family Medicine

## 2022-01-16 VITALS — BP 120/68 | HR 66 | Ht 66.0 in | Wt 188.1 lb

## 2022-01-16 DIAGNOSIS — Z0000 Encounter for general adult medical examination without abnormal findings: Secondary | ICD-10-CM

## 2022-01-16 DIAGNOSIS — E785 Hyperlipidemia, unspecified: Secondary | ICD-10-CM | POA: Diagnosis not present

## 2022-01-16 DIAGNOSIS — M546 Pain in thoracic spine: Secondary | ICD-10-CM

## 2022-01-16 DIAGNOSIS — N6324 Unspecified lump in the left breast, lower inner quadrant: Secondary | ICD-10-CM

## 2022-01-16 NOTE — Progress Notes (Signed)
Complete physical exam  Patient: Valerie Shaw   DOB: 30-Oct-1985   36 y.o. Female  MRN: [id number]  Subjective:    Chief Complaint  Patient presents with   Annual Exam    Left shoulder pain    Valerie Shaw is a 36 y.o. female who presents today for a complete physical exam. She reports consuming a general diet.  Has been having some pain between the left shoulder blade and spine.    She has twin 52-1/2-year-old son will oftentimes pick them up.  She is also a Theme park manager for living.  Sometimes the pain is a burning or shocklike sensation.  She generally feels well. . She does not have additional problems to discuss today.    Most recent fall risk assessment:    01/16/2022    9:04 AM  [place] in the past year? 0  Number falls in past yr: 0  Injury with Fall? 0  Risk for fall due to : No Fall Risks  Follow up Falls evaluation completed     Most recent depression screenings:    01/16/2022    9:04 AM 05/02/2021    9:50 AM  PHQ 2/9 Scores  PHQ - 2 Score 0 0        Patient Care Team: Hali Marry, MD as PCP - General   Outpatient Medications Prior to Visit  Medication Sig   Semaglutide, 2 MG/DOSE, (OZEMPIC, 2 MG/DOSE,) 8 MG/3ML SOPN Inject 2 mg into the skin once a week.   [DISCONTINUED] cephALEXin (KEFLEX) 500 MG capsule Take 1 capsule (500 mg total) by mouth 2 (two) times daily.   No facility-administered medications prior to visit.    ROS        Objective:     BP 120/68 (BP Location: Left Arm, Patient Position: Sitting, Cuff Size: Large)   Pulse 66   Ht '5\' 6"'$  (1.676 m)   Wt 188 lb 1.9 oz (85.3 kg)   SpO2 100%   BMI 30.36 kg/m    Physical Exam Vitals reviewed.  Constitutional:      Appearance: She is well-developed.  HENT:     Head: Normocephalic and atraumatic.     Right Ear: Tympanic membrane, ear canal and external ear normal.     Left Ear: Tympanic membrane, ear canal and external ear normal.     Nose: Nose normal.      Mouth/Throat:     Pharynx: Oropharynx is clear.  Eyes:     Conjunctiva/sclera: Conjunctivae normal.     Pupils: Pupils are equal, round, and reactive to light.  Neck:     Thyroid: No thyromegaly.  Cardiovascular:     Rate and Rhythm: Normal rate and regular rhythm.     Heart sounds: Normal heart sounds.  Pulmonary:     Effort: Pulmonary effort is normal.     Breath sounds: Normal breath sounds. No wheezing.  Chest:    Musculoskeletal:     Cervical back: Neck supple.  Lymphadenopathy:     Cervical: No cervical adenopathy.  Skin:    General: Skin is warm and dry.     Coloration: Skin is not pale.  Neurological:     Mental Status: She is alert and oriented to person, place, and time.  Psychiatric:        Behavior: Behavior normal.      No results found for any visits on 01/16/22.     Assessment & Plan:    Routine  Health Maintenance and Physical Exam  Immunization History  Administered Date(s) Administered   Influenza,inj,Quad PF,6+ Mos 01/05/2013, 11/05/2014, 02/23/2018, 11/17/2018, 01/21/2020, 01/23/2021   Tdap 01/05/2013, 04/27/2018    Health Maintenance  Topic Date Due   INFLUENZA VACCINE  05/12/2022 (Originally 09/11/2021)   Pneumococcal Vaccine 27-44 Years old (1 - PCV) 01/17/2023 (Originally 04/03/1990)   COVID-19 Vaccine (1) 02/02/2023 (Originally [birth year]01/1984)   PAP SMEAR-Modifier  01/20/2025   DTaP/Tdap/Td (3 - Td or Tdap) 04/26/2028   Hepatitis C Screening  Completed   HIV Screening  Completed   HPV VACCINES  Aged Out    Discussed health benefits of physical activity, and encouraged her to engage in regular exercise appropriate for her age and condition.  Problem List Items Addressed This Visit   None Visit Diagnoses     Wellness examination    -  Primary   Relevant Orders   Lipid Panel w/reflex Direct LDL   COMPLETE METABOLIC PANEL WITH GFR   CBC   HgB A1c   Hyperlipidemia, unspecified hyperlipidemia type       Relevant Orders   Lipid Panel  w/reflex Direct LDL   COMPLETE METABOLIC PANEL WITH GFR   CBC   HgB A1c   Acute left-sided thoracic back pain       Breast lump on left side at 8 o'clock position       Relevant Orders   MM Digital Diagnostic Bilat   US BREAST COMPLETE UNI LEFT INC AXILLA     Keep up a regular exercise program and make sure you are eating a healthy diet Try to eat 4 servings of dairy a day, or if you are lactose intolerant take a calcium with vitamin D daily.  Your vaccines are up to date.   Palpated two firm breast lumps in the left breast.  Possible calcified duct but I would like to refer her for further work-up and evaluation  Left-sided rhomboid pain.  Pain in the left rhomboids.  Tender to palpation on exam.  Given handout to do some home PT.  If not improving please let us know.  No follow-ups on file.     Beatrice Lecher, MD

## 2022-01-17 LAB — COMPLETE METABOLIC PANEL WITH GFR
AG Ratio: 1.8 (calc) (ref 1.0–2.5)
ALT: 3 U/L — ABNORMAL LOW (ref 6–29)
AST: 15 U/L (ref 10–30)
Albumin: 4.6 g/dL (ref 3.6–5.1)
Alkaline phosphatase (APISO): 64 U/L (ref 31–125)
BUN: 18 mg/dL (ref 7–25)
CO2: 25 mmol/L (ref 20–32)
Calcium: 9.3 mg/dL (ref 8.6–10.2)
Chloride: 103 mmol/L (ref 98–110)
Creat: 0.94 mg/dL (ref 0.50–0.97)
Globulin: 2.5 g/dL (calc) (ref 1.9–3.7)
Glucose, Bld: 83 mg/dL (ref 65–99)
Potassium: 4.3 mmol/L (ref 3.5–5.3)
Sodium: 137 mmol/L (ref 135–146)
Total Bilirubin: 0.6 mg/dL (ref 0.2–1.2)
Total Protein: 7.1 g/dL (ref 6.1–8.1)
eGFR: 80 mL/min/{1.73_m2} (ref >=60)

## 2022-01-17 LAB — HEMOGLOBIN A1C
Hgb A1c MFr Bld: 5.1 % of total Hgb (ref <5.7)
Mean Plasma Glucose: 100 mg/dL
eAG (mmol/L): 5.5 mmol/L

## 2022-01-17 LAB — LIPID PANEL W/REFLEX DIRECT LDL
Cholesterol: 159 mg/dL (ref <200)
HDL: 53 mg/dL (ref >=50)
LDL Cholesterol (Calc): 90 mg/dL (calc)
Non-HDL Cholesterol (Calc): 106 mg/dL (calc) (ref <130)
Total CHOL/HDL Ratio: 3 (calc) (ref <5.0)
Triglycerides: 71 mg/dL (ref <150)

## 2022-01-17 LAB — CBC
HCT: 41.2 % (ref 35.0–45.0)
Hemoglobin: 14.1 g/dL (ref 11.7–15.5)
MCH: 30.5 pg (ref 27.0–33.0)
MCHC: 34.2 g/dL (ref 32.0–36.0)
MCV: 89.2 fL (ref 80.0–100.0)
MPV: 11.6 fL (ref 7.5–12.5)
Platelets: 215 10*3/uL (ref 140–400)
RBC: 4.62 10*6/uL (ref 3.80–5.10)
RDW: 12.4 % (ref 11.0–15.0)
WBC: 7.1 10*3/uL (ref 3.8–10.8)

## 2022-01-18 NOTE — Progress Notes (Signed)
Your lab work is within acceptable range and there are no concerning findings.   ?

## 2022-02-12 ENCOUNTER — Ambulatory Visit
Admission: RE | Admit: 2022-02-12 | Discharge: 2022-02-12 | Disposition: A | Discharge status: Home / Self Care | Payer: 59 | Source: Ambulatory Visit | Attending: Family Medicine | Admitting: Family Medicine

## 2022-02-12 DIAGNOSIS — N6324 Unspecified lump in the left breast, lower inner quadrant: Secondary | ICD-10-CM

## 2022-07-17 ENCOUNTER — Telehealth: Payer: Self-pay | Admitting: Family Medicine

## 2022-07-17 ENCOUNTER — Encounter: Payer: Self-pay | Admitting: Family Medicine

## 2022-07-17 NOTE — Telephone Encounter (Signed)
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

## 2022-07-17 NOTE — Telephone Encounter (Signed)
Pt states that it was a lump that was dark with spot in the middle. She thinks it may be a corn but she's unsure. She did try picking the area.   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 I advised her to do some epsom salt soaks, massage, pain reliever, and frozen water bottle to roll her foot on.   Advised her to schedule an appt for this ASAP she stated that she would

## 2022-10-23 ENCOUNTER — Telehealth: Payer: Self-pay | Admitting: Family Medicine

## 2022-10-23 ENCOUNTER — Telehealth: Payer: Self-pay

## 2022-10-23 DIAGNOSIS — Z7689 Persons encountering health services in other specified circumstances: Secondary | ICD-10-CM

## 2022-10-23 NOTE — Telephone Encounter (Signed)
Patient called requesting a refill and PA of ; Semaglutide, 2 MG/DOSE, (OZEMPIC, 2 MG/DOSE,) 8 MG/3ML SOPN [[id number]]  Pharmacy ;   [facility] [phone] - [place], [place] - [address] AT [facility] [address]

## 2022-10-23 NOTE — Telephone Encounter (Signed)
Initiated Prior authorization [id number] (2 MG/DOSE) 8MG /3ML pen-injectors Via: Covermymeds Case/Key:[id number] Status: approved as ofd 10/23/22 Reason:Coverage End Date:10/23/2023; Notified Pt via: Mychart

## 2022-10-25 MED ORDER — OZEMPIC (2 MG/DOSE) 8 MG/3ML ~~LOC~~ SOPN: 2.0000 mg | PEN_INJECTOR | SUBCUTANEOUS | 0 refills | Status: DC

## 2022-10-25 NOTE — Telephone Encounter (Signed)
Sent 1 month supply and patient sig: NEEDS APPOINTMENT FOR FURTHER REFILLS.

## 2022-11-13 ENCOUNTER — Other Ambulatory Visit: Payer: Self-pay | Admitting: Family Medicine

## 2022-11-13 DIAGNOSIS — Z7689 Persons encountering health services in other specified circumstances: Secondary | ICD-10-CM

## 2022-11-15 NOTE — Telephone Encounter (Signed)
Please call pt and advise her that she is overdue for f/u for Ozempic refills. Thanks!!

## 2022-11-15 NOTE — Telephone Encounter (Signed)
Informed pt she is over due for a f/u. Pt stated she will call back to schedule appt.

## 2023-01-23 ENCOUNTER — Ambulatory Visit (INDEPENDENT_AMBULATORY_CARE_PROVIDER_SITE_OTHER): Payer: 59 | Admitting: Family Medicine

## 2023-01-23 ENCOUNTER — Other Ambulatory Visit: Payer: Self-pay | Admitting: Family Medicine

## 2023-01-23 ENCOUNTER — Encounter: Payer: Self-pay | Admitting: Family Medicine

## 2023-01-23 VITALS — BP 136/79 | HR 78 | Ht 66.0 in | Wt 226.0 lb

## 2023-01-23 DIAGNOSIS — R5383 Other fatigue: Secondary | ICD-10-CM | POA: Diagnosis not present

## 2023-01-23 DIAGNOSIS — Z0000 Encounter for general adult medical examination without abnormal findings: Secondary | ICD-10-CM

## 2023-01-23 DIAGNOSIS — E282 Polycystic ovarian syndrome: Secondary | ICD-10-CM

## 2023-01-23 DIAGNOSIS — Z7689 Persons encountering health services in other specified circumstances: Secondary | ICD-10-CM | POA: Diagnosis not present

## 2023-01-23 MED ORDER — OZEMPIC (0.25 OR 0.5 MG/DOSE) 2 MG/3ML ~~LOC~~ SOPN: 0.5000 mg | PEN_INJECTOR | SUBCUTANEOUS | 0 refills | Status: DC

## 2023-01-23 NOTE — Progress Notes (Signed)
Complete physical exam  Patient: Valerie Shaw   DOB: 1985/09/16   37 y.o. Female  MRN: [id number]  Subjective:    Chief Complaint  Patient presents with   Annual Exam    Valerie Shaw is a 37 y.o. female who presents today for a complete physical exam. She reports consuming a general diet. The patient does not participate in regular exercise at present. She generally feels fairly well. She does have additional problems to discuss today.   She is felt more down and sad lately.   Most recent fall risk assessment:    01/23/2023   10:49 AM  Fall Risk   Falls in the past year? 0  Number falls in past yr: 0  Injury with Fall? 0  Risk for fall due to : No Fall Risks  Follow up Falls evaluation completed     Most recent depression screenings:    01/23/2023   11:30 AM 01/23/2023   11:26 AM  PHQ 2/9 Scores  PHQ - 2 Score 1 2  PHQ- 9 Score 3 5        Patient Care Team: Valerie Games, MD as PCP - General   Outpatient Medications Prior to Visit  Medication Sig   levonorgestrel (LILETTA, 52 MG,) 20.1 MCG/DAY IUD IUD 1 each by Intrauterine route once. Placed 08/10/18   [DISCONTINUED] Semaglutide, 2 MG/DOSE, (OZEMPIC, 2 MG/DOSE,) 8 MG/3ML SOPN INJECT 2 MG INTO SKIN ONCE WEEKLY   No facility-administered medications prior to visit.    ROS        Objective:     BP 136/79   Pulse 78   Ht 5\' 6"  (1.676 m)   Wt 226 lb (102.5 kg)   SpO2 100%   BMI 36.48 kg/m    Physical Exam Exam conducted with a chaperone present.  Constitutional:      Appearance: Normal appearance.  HENT:     Head: Normocephalic and atraumatic.     Right Ear: Tympanic membrane, ear canal and external ear normal.     Left Ear: Tympanic membrane, ear canal and external ear normal.     Nose: Nose normal.     Mouth/Throat:     Pharynx: Oropharynx is clear.  Eyes:     Extraocular Movements: Extraocular movements intact.     Conjunctiva/sclera: Conjunctivae normal.      Pupils: Pupils are equal, round, and reactive to light.  Neck:     Thyroid: No thyromegaly.  Cardiovascular:     Rate and Rhythm: Normal rate and regular rhythm.  Pulmonary:     Effort: Pulmonary effort is normal.     Breath sounds: Normal breath sounds.  Chest:     Chest wall: No mass.  Breasts:    Right: Normal. No mass, nipple discharge or skin change.     Left: Normal. No mass, nipple discharge or skin change.  Abdominal:     General: Bowel sounds are normal.     Palpations: Abdomen is soft.     Tenderness: There is no abdominal tenderness.  Musculoskeletal:        General: No swelling.     Cervical back: Neck supple.  Lymphadenopathy:     Upper Body:     Right upper body: No supraclavicular, axillary or pectoral adenopathy.     Left upper body: No supraclavicular, axillary or pectoral adenopathy.  Skin:    General: Skin is warm and dry.  Neurological:     Mental Status: She is oriented  to person, place, and time.  Psychiatric:        Mood and Affect: Mood normal.        Behavior: Behavior normal.      No results found for any visits on 01/23/23.     Assessment & Plan:    Routine Health Maintenance and Physical Exam  Immunization History  Administered Date(s) Administered   Influenza,inj,Quad PF,6+ Mos 01/05/2013, 11/05/2014, 02/23/2018, 11/17/2018, 01/21/2020, 01/23/2021   Tdap 01/05/2013, 04/27/2018    Health Maintenance  Topic Date Due   INFLUENZA VACCINE  05/12/2023 (Originally 09/12/2022)   Pneumococcal Vaccine 70-36 Years old (1 of 2 - PCV) 01/23/2024 (Originally 04/03/1990)   COVID-19 Vaccine (1 - 2024-25 season) 02/08/2024 (Originally 10/13/2022)   Cervical Cancer Screening (HPV/Pap Cotest)  01/20/2025   DTaP/Tdap/Td (3 - Td or Tdap) 04/26/2028   Hepatitis C Screening  Completed   HIV Screening  Completed   HPV VACCINES  Aged Out    Discussed health benefits of physical activity, and encouraged her to engage in regular exercise appropriate for her age  and condition.  Problem List Items Addressed This Visit       Endocrine   PCOS (polycystic ovarian syndrome)   He felt like she was doing really well on the Ozempic but had some things come up and ended up having to stop it and then restarted it.  So she has been doing clicks instead of the full dose because all she has at home is 2 mg so she like to restart back at 0.5 mg she feels like it made a really big difference with some of her PCOS symptoms.  She felt like it was helping with some of the hormonal fluctuations she was experiencing, mood, and even with skin changes and hair growth.  She was actually starting to have some regular periods again which she liked.      Relevant Orders   CMP14+EGFR   Lipid panel   CBC   TSH   Progesterone   Estradiol   Follicle stimulating hormone   Luteinizing hormone     Other   Encounter for weight management   Other Visit Diagnoses       Wellness examination    -  Primary   Relevant Orders   CMP14+EGFR   Lipid panel   CBC   TSH   Progesterone   Estradiol   Follicle stimulating hormone   Luteinizing hormone     Other fatigue       Relevant Orders   CMP14+EGFR   Lipid panel   CBC   TSH   Progesterone   Estradiol   Follicle stimulating hormone   Luteinizing hormone      Keep up a regular exercise program and make sure you are eating a healthy diet Try to eat 4 servings of dairy a day, or if you are lactose intolerant take a calcium with vitamin D daily.  Your vaccines are up to date.    No follow-ups on file.     Nani Gasser, MD

## 2023-01-23 NOTE — Assessment & Plan Note (Signed)
He felt like she was doing really well on the Ozempic but had some things come up and ended up having to stop it and then restarted it.  So she has been doing clicks instead of the full dose because all she has at home is 2 mg so she like to restart back at 0.5 mg she feels like it made a really big difference with some of her PCOS symptoms.  She felt like it was helping with some of the hormonal fluctuations she was experiencing, mood, and even with skin changes and hair growth.  She was actually starting to have some regular periods again which she liked.

## 2023-01-24 LAB — LIPID PANEL
Chol/HDL Ratio: 3.6 {ratio} (ref 0.0–4.4)
Cholesterol, Total: 180 mg/dL (ref 100–199)
HDL: 50 mg/dL (ref >39)
LDL Chol Calc (NIH): 114 mg/dL — ABNORMAL HIGH (ref 0–99)
Triglycerides: 84 mg/dL (ref 0–149)
VLDL Cholesterol Cal: 16 mg/dL (ref 5–40)

## 2023-01-24 LAB — CBC
Hematocrit: 45.1 % (ref 34.0–46.6)
Hemoglobin: 14.9 g/dL (ref 11.1–15.9)
MCH: 30.3 pg (ref 26.6–33.0)
MCHC: 33 g/dL (ref 31.5–35.7)
MCV: 92 fL (ref 79–97)
Platelets: 236 10*3/uL (ref 150–450)
RBC: 4.91 x10E6/uL (ref 3.77–5.28)
RDW: 12.3 % (ref 11.7–15.4)
WBC: 7.1 10*3/uL (ref 3.4–10.8)

## 2023-01-24 LAB — CMP14+EGFR
ALT: 10 [IU]/L (ref 0–32)
AST: 18 [IU]/L (ref 0–40)
Albumin: 4.6 g/dL (ref 3.9–4.9)
Alkaline Phosphatase: 81 [IU]/L (ref 44–121)
BUN/Creatinine Ratio: 14 (ref 9–23)
BUN: 13 mg/dL (ref 6–20)
Bilirubin Total: 0.6 mg/dL (ref 0.0–1.2)
CO2: 23 mmol/L (ref 20–29)
Calcium: 10 mg/dL (ref 8.7–10.2)
Chloride: 103 mmol/L (ref 96–106)
Creatinine, Ser: 0.95 mg/dL (ref 0.57–1.00)
Globulin, Total: 2.5 g/dL (ref 1.5–4.5)
Glucose: 87 mg/dL (ref 70–99)
Potassium: 4.5 mmol/L (ref 3.5–5.2)
Sodium: 139 mmol/L (ref 134–144)
Total Protein: 7.1 g/dL (ref 6.0–8.5)
eGFR: 79 mL/min/{1.73_m2} (ref >59)

## 2023-01-24 LAB — LUTEINIZING HORMONE: LH: 3.2 m[IU]/mL

## 2023-01-24 LAB — FOLLICLE STIMULATING HORMONE: FSH: 3.3 m[IU]/mL

## 2023-01-24 LAB — TSH: TSH: 1.95 u[IU]/mL (ref 0.450–4.500)

## 2023-01-24 LAB — PROGESTERONE: Progesterone: 5 ng/mL

## 2023-01-24 LAB — ESTRADIOL: Estradiol: 93.3 pg/mL

## 2023-01-24 NOTE — Progress Notes (Signed)
Hi Valerie Shaw, LDL is slightly elevated at 114.  Goal is under 100.  Continue to work on healthy diet and regular exercise to bring that down.  Metabolic panel and blood count look great.  Thyroid level looks great.  Hormone levels look normal which is reassuring.

## 2023-02-18 ENCOUNTER — Telehealth: Payer: 59 | Admitting: Nurse Practitioner

## 2023-02-18 DIAGNOSIS — J020 Streptococcal pharyngitis: Secondary | ICD-10-CM | POA: Diagnosis not present

## 2023-02-18 MED ORDER — AMOXICILLIN 500 MG PO CAPS: 500.0000 mg | ORAL_CAPSULE | Freq: Two times a day (BID) | ORAL | 0 refills | Status: AC

## 2023-02-18 NOTE — Progress Notes (Signed)
 Virtual Visit Consent   Valerie Shaw, you are scheduled for a virtual visit with a [facility] provider today. Just as with appointments in the office, your consent must be obtained to participate. Your consent will be active for this visit and any virtual visit you may have with one of our providers in the next 365 days. If you have a MyChart account, a copy of this consent can be sent to you electronically.  As this is a virtual visit, video technology does not allow for your provider to perform a traditional examination. This may limit your provider's ability to fully assess your condition. If your provider identifies any concerns that need to be evaluated in person or the need to arrange testing (such as labs, EKG, etc.), we will make arrangements to do so. Although advances in technology are sophisticated, we cannot ensure that it will always work on either your end or our end. If the connection with a video visit is poor, the visit may have to be switched to a telephone visit. With either a video or telephone visit, we are not always able to ensure that we have a secure connection.  By engaging in this virtual visit, you consent to the provision of healthcare and authorize for your insurance to be billed (if applicable) for the services provided during this visit. Depending on your insurance coverage, you may receive a charge related to this service.  I need to obtain your verbal consent now. Are you willing to proceed with your visit today? Valerie Shaw has provided verbal consent on 02/18/2023 for a virtual visit (video or telephone). Lauraine Kitty, FNP  Date: 02/18/2023 9:24 AM  Virtual Visit via Video Note   I, Lauraine Kitty, connected with  Valerie Shaw  ([id number], [birth year]) on 02/18/23 at  9:45 AM EST by a video-enabled telemedicine application and verified that I am speaking with the correct person using two identifiers.  Location: Patient: Virtual Visit Location Patient:  Home Provider: Virtual Visit Location Provider: Home Office   I discussed the limitations of evaluation and management by telemedicine and the availability of in person appointments. The patient expressed understanding and agreed to proceed.    History of Present Illness: Valerie Shaw is a 38 y.o. who identifies as a female who was assigned female at birth, and is being seen today for sore throat and headache.  She has had mucous in her throat but denies a runny nose or sinus congestion  Has produced mucous from her throat  Hurts to swallow  Denies a fever   She feels she has not been taking care of [place]  Has been trying over the counter medications without any relief   Feels the way strep has felt in the past   Problems:  Patient Active Problem List   Diagnosis Date Noted   Encounter for weight management 05/02/2021   Left ear pain 12/11/2020   PCOS (polycystic ovarian syndrome) 01/18/2019   H/O unilateral oophorectomy 05/22/2018   Mucinous cystadenoma    BMI 39.0-39.9,adult 12/16/2016    Allergies: No Known Allergies Medications:  Current Outpatient Medications:    levonorgestrel  (LILETTA , 52 MG,) 20.1 MCG/DAY IUD IUD, 1 each by Intrauterine route once. Placed 08/10/18, Disp: , Rfl:    Semaglutide ,0.25 or 0.5MG /DOS, (OZEMPIC , 0.25 OR 0.5 MG/DOSE,) 2 MG/3ML SOPN, Inject 0.5 mg into the skin every 7 (seven) days., Disp: 3 mL, Rfl: 0  Observations/Objective: Patient is well-developed,  well-nourished in no acute distress.  Resting comfortably  at home.  Head is normocephalic atraumatic.  No labored breathing.  Speech is clear and coherent with logical content.  Patient is alert and oriented at baseline.    Assessment and Plan:  1. Strep throat (Primary)  - amoxicillin  (AMOXIL ) 500 MG capsule; Take 1 capsule (500 mg total) by mouth 2 (two) times daily for 10 days.  Dispense: 20 capsule; Refill: 0     Follow Up  Instructions: I discussed the assessment and treatment plan with the patient. The patient was provided an opportunity to ask questions and all were answered. The patient agreed with the plan and demonstrated an understanding of the instructions.  A copy of instructions were sent to the patient via MyChart unless otherwise noted below.    The patient was advised to call back or seek an in-person evaluation if the symptoms worsen or if the condition fails to improve as anticipated.    Lauraine Kitty, FNP

## 2023-04-07 ENCOUNTER — Other Ambulatory Visit: Payer: Self-pay | Admitting: Family Medicine

## 2023-04-11 NOTE — Telephone Encounter (Signed)
 Call patient and see if she wants to go ahead and move up to the 1 mg dose?  I think she had gotten up to 2 mg when she was on it previously.  Also if she does not have a follow-up scheduled we do need to do that probably in late  March because at some point her insurance will want documentation of an office visit note that she is doing well on the medication and I have not seen her since December.

## 2023-04-15 NOTE — Telephone Encounter (Signed)
 Attempted call to patient. Left a voice mail message requesting a return call.

## 2023-04-23 ENCOUNTER — Other Ambulatory Visit: Payer: Self-pay | Admitting: *Deleted

## 2023-04-23 MED ORDER — OZEMPIC (0.25 OR 0.5 MG/DOSE) 2 MG/3ML ~~LOC~~ SOPN: 0.5000 mg | PEN_INJECTOR | SUBCUTANEOUS | 0 refills | Status: AC

## 2023-09-25 ENCOUNTER — Other Ambulatory Visit (HOSPITAL_COMMUNITY): Payer: Self-pay

## 2023-09-25 ENCOUNTER — Telehealth: Payer: Self-pay

## 2023-09-25 NOTE — Telephone Encounter (Signed)
 Pharmacy Patient Advocate Encounter   Received notification from CoverMyMeds that prior authorization for Ozempic  8mg /32ml is due for renewal.   Insurance verification completed.   The patient is insured through [facility].  Ozempic Brena is approved exclusively as an adjunct to diet and exercise to improve glycemic control in adults with type 2 diabetes mellitus. A review of patient's medical chart reveals no documented diagnosis of type 2 diabetes or an A1C indicative of diabetes. Therefore, they do not  currently meet the criteria for prior authorization of this medication. If clinically appropriate, alternative  options such as Saxenda , Zepbound, or Wegovy  may be considered for this patient.

## 2023-09-29 NOTE — Telephone Encounter (Signed)
 Please call patient and let her know that it was denied for the Ozempic  but they will potentially cover Wegovy  or Zepbound for weight loss.  We can always have her schedule a virtual visit if that would work we just have to document where she is starting from and goals and that we are working on diet and nutrition to be successful.  Not sure if she is still interested or not

## 2023-09-30 NOTE — Telephone Encounter (Signed)
 Spoke with patient - she is going to hold off on this for now. She will discuss this if she decides to pursue it at her next physical with Dr. Alvan.

## 2023-10-06 ENCOUNTER — Other Ambulatory Visit (HOSPITAL_COMMUNITY): Payer: Self-pay

## 2024-01-30 ENCOUNTER — Ambulatory Visit
Admission: EM | Admit: 2024-01-30 | Discharge: 2024-01-30 | Disposition: A | Discharge status: Home / Self Care | Attending: Family Medicine | Admitting: Family Medicine

## 2024-01-30 ENCOUNTER — Other Ambulatory Visit: Payer: Self-pay

## 2024-01-30 DIAGNOSIS — J209 Acute bronchitis, unspecified: Secondary | ICD-10-CM

## 2024-01-30 LAB — POCT INFLUENZA A/B
Influenza A, POC: NEGATIVE
Influenza B, POC: NEGATIVE

## 2024-01-30 LAB — POC SOFIA SARS ANTIGEN FIA: SARS Coronavirus 2 Ag: NEGATIVE

## 2024-01-30 MED ORDER — AZITHROMYCIN 250 MG PO TABS: ORAL_TABLET | ORAL | 0 refills | Status: AC

## 2024-01-30 NOTE — ED Triage Notes (Signed)
 Pt presenting with c/o non productive cough, body aches and wheezing  x 5 days. Pt stated that she began having a sore throat this AM Pt stated that she has been taking Mucinex DM and Mucinex cough drops with minimal effectiveness.

## 2024-01-30 NOTE — ED Provider Notes (Incomplete)
 " Valerie Shaw    CSN: [id number] Arrival date & time: 01/30/24  9185      History   Chief Complaint Chief Complaint  Patient presents with   Cough    HPI Valerie Shaw is a 38 y.o. female.    Cough   Past Medical History:  Diagnosis Date   Complication of anesthesia    have a hard time waking her up, felt hot, nausea and vomiting   Foot fracture    age 89   Headache    Infertility, female    clomid and stimulation meds in past; treated for years    Mucinous cystadenoma    PCOS (polycystic ovarian syndrome)    Supervision of high risk pregnancy, antepartum 02/23/2018    Nursing Staff Provider Office Location  [place] Dating   2nd trimester Language   [employer] [facility] US    Flu Vaccine  02/23/18  Genetic Screen  declines   TDaP vaccine    Hgb A1C or  GTT Early: 5.1 Third trimester  Rhogam   n/a   LAB RESULTS  Feeding Plan  breast/bottle Blood Type B/RH(D) POSITIVE/-- (01/13 1121) B+ Contraception  unsure Antibody NO ANTIBODIES DETECTED (01/13 1121) Circumcision  Rubel   Weight gain     Patient Active Problem List   Diagnosis Date Noted   Encounter for weight management 05/02/2021   Left ear pain 12/11/2020   PCOS (polycystic ovarian syndrome) 01/18/2019   H/O unilateral oophorectomy 05/22/2018   Mucinous cystadenoma    BMI 39.0-39.9,adult 12/16/2016    Past Surgical History:  Procedure Laterality Date   ARTHROSCOPIC REPAIR ACL     CESAREAN SECTION     CESAREAN SECTION N/A 05/31/2018   Procedure: CESAREAN SECTION;  Surgeon: Lorence Ozell CROME, MD;  Location: [facility];  Service: Obstetrics;  Laterality: N/A;   LAPAROSCOPIC BILATERAL SALPINGO OOPHERECTOMY N/A 06/19/2017   Procedure: EXPLORATORY LAPAROTOMY RIGHT SALPINGO OOPHORECTOMY;  Surgeon: Anitra Freddy NOVAK, MD;  Location: [facility] ORS;  Service: Gynecology;  Laterality: N/A;   WISDOM TOOTH EXTRACTION      OB History     Gravida  2   Para  2   Term  1   Preterm  1   AB      Living  3      SAB       IAB      Ectopic      Multiple  1   Live Births  3            Home Medications    Prior to Admission medications  Medication Sig Start Date End Date Taking? Authorizing Provider  azithromycin  (ZITHROMAX  Z-PAK) 250 MG tablet Take 2 tabs today; then begin one tab once daily for 4 more days. 01/30/24  Yes Pauline Garnette LABOR, MD  levonorgestrel  (LILETTA , 52 MG,) 20.1 MCG/DAY IUD IUD 1 each by Intrauterine route once. Placed 08/10/18    [provider]  Semaglutide ,0.25 or 0.5MG /DOS, (OZEMPIC , 0.25 OR 0.5 MG/DOSE,) 2 MG/3ML SOPN Inject 0.5 mg into the skin once a week. 04/23/23   Alvan Dorothyann BIRCH, MD    Family History Family History  Problem Relation Age of Onset   Hypothyroidism Mother    Drug abuse Father    Hypertension Other    Cancer Paternal Uncle        unkown primary   Lung cancer Maternal Grandfather        smoker    Social History Social History[1]  Allergies   Patient has no known allergies.   Review of Systems Review of Systems  Respiratory:  Positive for cough.      Physical Exam Triage Vital Signs ED Triage Vitals  Encounter Vitals Group     BP 01/30/24 0833 118/83     Girls Systolic BP Percentile --      Girls Diastolic BP Percentile --      Boys Systolic BP Percentile --      Boys Diastolic BP Percentile --      Pulse Rate 01/30/24 0833 79     Resp 01/30/24 0833 18     Temp 01/30/24 0833 98.2 F (36.8 C)     Temp Source 01/30/24 0833 Oral     SpO2 01/30/24 0833 96 %     Weight 01/30/24 0835 225 lb (102.1 kg)     Height 01/30/24 0835 5' 5 (1.651 m)     Head Circumference --      Peak Flow --      Pain Score 01/30/24 0835 0     Pain Loc --      Pain Education --      Exclude from Growth Chart --    No data found.  Updated Vital Signs BP 118/83 (BP Location: Right Arm)   Pulse 79   Temp 98.2 F (36.8 C) (Oral)   Resp 18   Ht 5' 5 (1.651 m)   Wt 102.1 kg   SpO2 96%   BMI 37.44 kg/m   Visual Acuity Right  Eye Distance:   Left Eye Distance:   Bilateral Distance:    Right Eye Near:   Left Eye Near:    Bilateral Near:     Physical Exam   UC Treatments / Results  Labs (all labs ordered are listed, but only abnormal results are displayed) Labs Reviewed  POCT INFLUENZA A/B - Normal  POC SOFIA SARS ANTIGEN FIA - Normal    EKG   Radiology No results found.  Procedures Procedures (including critical Shaw time)  Medications Ordered in UC Medications - No data to display  Initial Impression / Assessment and Plan / UC Course  I have reviewed the triage vital signs and the nursing notes.  Pertinent labs & imaging results that were available during my Shaw of the patient were reviewed by me and considered in my medical decision making (see chart for details).    Begin Z-pack. Followup with Family Doctor if not improved in 5 to 7 days  Final Clinical Impressions(s) / UC Diagnoses   Final diagnoses:  Acute bronchitis, unspecified organism     Discharge Instructions      Take plain guaifenesin (1200mg  extended release tabs such as Mucinex) twice daily, with plenty of water , for cough and congestion.  Get adequate rest.   May take Delsym Cough Suppressant (12 Hour Cough Relief) at bedtime for nighttime cough.  Try warm salt water  gargles for sore throat.    May take Ibuprofen  200mg , 4 tabs every 8 hours with food for chest/sternum discomfort.   If symptoms become significantly worse during the night or over the weekend, proceed to the local emergency room.     ED Prescriptions     Medication Sig Dispense Auth. Provider   azithromycin  (ZITHROMAX  Z-PAK) 250 MG tablet Take 2 tabs today; then begin one tab once daily for 4 more days. 6 tablet Pauline Garnette LABOR, MD      PDMP not reviewed this encounter.    [1]  Social History Tobacco Use   Smoking status: Every Day    Current packs/day: 0.00    Average packs/day: 0.5 packs/day    Types: Cigarettes    Last attempt  to quit: 02/17/2018    Years since quitting: 5.9   Smokeless tobacco: Never   Tobacco comments:    3 cigarettes a day   Vaping Use   Vaping status: Never Used  Substance Use Topics   Alcohol use: Not Currently    Comment: 2 / month   Drug use: No   "

## 2024-01-30 NOTE — Discharge Instructions (Signed)
 Take plain guaifenesin (1200mg  extended release tabs such as Mucinex) twice daily, with plenty of water , for cough and congestion.  Get adequate rest.   May take Delsym Cough Suppressant (12 Hour Cough Relief) at bedtime for nighttime cough.  Try warm salt water  gargles for sore throat.    May take Ibuprofen  200mg , 4 tabs every 8 hours with food for chest/sternum discomfort.   If symptoms become significantly worse during the night or over the weekend, proceed to the local emergency room.
